# Patient Record
Sex: Male | Born: 1960 | Race: White | Hispanic: No | Marital: Married | Smoking: Current every day smoker
Health system: Southern US, Community
[De-identification: ages and names within clinical notes are randomized; demographics above are authoritative.]

## PROBLEM LIST (undated history)

## (undated) DIAGNOSIS — Z8711 Personal history of peptic ulcer disease: Secondary | ICD-10-CM

## (undated) DIAGNOSIS — K219 Gastro-esophageal reflux disease without esophagitis: Secondary | ICD-10-CM

## (undated) DIAGNOSIS — G8929 Other chronic pain: Secondary | ICD-10-CM

## (undated) DIAGNOSIS — Z87442 Personal history of urinary calculi: Secondary | ICD-10-CM

## (undated) DIAGNOSIS — Z972 Presence of dental prosthetic device (complete) (partial): Secondary | ICD-10-CM

## (undated) DIAGNOSIS — R0981 Nasal congestion: Secondary | ICD-10-CM

## (undated) DIAGNOSIS — M549 Dorsalgia, unspecified: Secondary | ICD-10-CM

## (undated) DIAGNOSIS — R5383 Other fatigue: Secondary | ICD-10-CM

## (undated) DIAGNOSIS — Z8719 Personal history of other diseases of the digestive system: Secondary | ICD-10-CM

## (undated) DIAGNOSIS — F32A Depression, unspecified: Secondary | ICD-10-CM

## (undated) DIAGNOSIS — E785 Hyperlipidemia, unspecified: Secondary | ICD-10-CM

## (undated) DIAGNOSIS — F419 Anxiety disorder, unspecified: Secondary | ICD-10-CM

## (undated) DIAGNOSIS — G629 Polyneuropathy, unspecified: Secondary | ICD-10-CM

## (undated) DIAGNOSIS — T8484XA Pain due to internal orthopedic prosthetic devices, implants and grafts, initial encounter: Secondary | ICD-10-CM

## (undated) DIAGNOSIS — I10 Essential (primary) hypertension: Secondary | ICD-10-CM

## (undated) DIAGNOSIS — F329 Major depressive disorder, single episode, unspecified: Secondary | ICD-10-CM

## (undated) DIAGNOSIS — Z550 Illiteracy and low-level literacy: Secondary | ICD-10-CM

## (undated) DIAGNOSIS — F1911 Other psychoactive substance abuse, in remission: Secondary | ICD-10-CM

## (undated) DIAGNOSIS — E291 Testicular hypofunction: Secondary | ICD-10-CM

## (undated) HISTORY — DX: Gastro-esophageal reflux disease without esophagitis: K21.9

## (undated) HISTORY — PX: ELBOW SURGERY: SHX618

## (undated) HISTORY — PX: CHOLECYSTECTOMY: SHX55

## (undated) HISTORY — PX: NASAL SEPTOPLASTY W/ TURBINOPLASTY: SHX2070

## (undated) HISTORY — DX: Polyneuropathy, unspecified: G62.9

## (undated) HISTORY — DX: Other fatigue: R53.83

## (undated) HISTORY — PX: ESOPHAGOGASTRODUODENOSCOPY: SHX1529

## (undated) HISTORY — DX: Testicular hypofunction: E29.1

## (undated) HISTORY — DX: Other psychoactive substance abuse, in remission: F19.11

## (undated) HISTORY — DX: Other chronic pain: G89.29

## (undated) HISTORY — PX: TONSILLECTOMY: SUR1361

---

## 2002-10-05 ENCOUNTER — Ambulatory Visit (HOSPITAL_COMMUNITY): Admission: RE | Admit: 2002-10-05 | Discharge: 2002-10-05 | Payer: Self-pay | Admitting: Family Medicine

## 2002-10-05 ENCOUNTER — Encounter: Payer: Self-pay | Admitting: Family Medicine

## 2002-12-01 ENCOUNTER — Emergency Department (HOSPITAL_COMMUNITY): Admission: EM | Admit: 2002-12-01 | Discharge: 2002-12-01 | Payer: Self-pay | Admitting: Emergency Medicine

## 2002-12-27 ENCOUNTER — Encounter (HOSPITAL_COMMUNITY): Admission: RE | Admit: 2002-12-27 | Discharge: 2003-01-26 | Payer: Self-pay | Admitting: Orthopedic Surgery

## 2003-01-27 ENCOUNTER — Encounter (HOSPITAL_COMMUNITY): Admission: RE | Admit: 2003-01-27 | Discharge: 2003-02-26 | Payer: Self-pay | Admitting: Orthopedic Surgery

## 2003-09-14 ENCOUNTER — Encounter (HOSPITAL_COMMUNITY): Admission: RE | Admit: 2003-09-14 | Discharge: 2003-10-14 | Payer: Self-pay | Admitting: Preventative Medicine

## 2003-10-09 ENCOUNTER — Ambulatory Visit (HOSPITAL_COMMUNITY): Admission: RE | Admit: 2003-10-09 | Discharge: 2003-10-09 | Payer: Self-pay | Admitting: Internal Medicine

## 2004-07-24 ENCOUNTER — Ambulatory Visit (HOSPITAL_COMMUNITY): Admission: RE | Admit: 2004-07-24 | Discharge: 2004-07-24 | Payer: Self-pay | Admitting: Family Medicine

## 2004-09-10 ENCOUNTER — Encounter (HOSPITAL_COMMUNITY): Admission: RE | Admit: 2004-09-10 | Discharge: 2004-10-10 | Payer: Self-pay | Admitting: Neurosurgery

## 2004-10-10 ENCOUNTER — Encounter (HOSPITAL_COMMUNITY): Admission: RE | Admit: 2004-10-10 | Discharge: 2004-11-09 | Payer: Self-pay | Admitting: Neurosurgery

## 2005-01-02 ENCOUNTER — Observation Stay (HOSPITAL_COMMUNITY): Admission: EM | Admit: 2005-01-02 | Discharge: 2005-01-02 | Payer: Self-pay | Admitting: Emergency Medicine

## 2005-01-02 HISTORY — PX: CARDIAC CATHETERIZATION: SHX172

## 2005-10-15 ENCOUNTER — Other Ambulatory Visit (HOSPITAL_COMMUNITY): Admission: RE | Admit: 2005-10-15 | Discharge: 2005-11-04 | Payer: Self-pay | Admitting: Psychiatry

## 2005-10-15 ENCOUNTER — Ambulatory Visit: Payer: Self-pay | Admitting: Psychiatry

## 2006-05-11 ENCOUNTER — Ambulatory Visit (HOSPITAL_COMMUNITY): Admission: RE | Admit: 2006-05-11 | Discharge: 2006-05-11 | Payer: Self-pay | Admitting: Family Medicine

## 2007-05-29 ENCOUNTER — Emergency Department (HOSPITAL_COMMUNITY): Admission: EM | Admit: 2007-05-29 | Discharge: 2007-05-29 | Payer: Self-pay | Admitting: Emergency Medicine

## 2010-12-20 NOTE — Cardiovascular Report (Signed)
NAME:  Lucas Ramos, Lucas Ramos NO.:  [id number]   MEDICAL RECORD NO.:  [id number]          PATIENT TYPE:  INP   LOCATION:  1826                         FACILITY:  [facility]   PHYSICIAN:  Nanetta Batty, M.D.   DATE OF BIRTH:  1960-08-25   DATE OF PROCEDURE:  01/02/2005  DATE OF DISCHARGE:                              CARDIAC CATHETERIZATION   HISTORY OF PRESENT ILLNESS:  [name] is a 50 year old male married  white male who works as a Corporate investment banker.  He has positive risk factors  for ischemic heart disease including tobacco abuse, hypertension, and  hyperlipidemia.  He has had chest pain for the last 24 hours.  He is on  heparin and nitroglycerin, with no acute changes on his EKG.  Point of care  markers were negative.  He presents now for diagnostic coronary  arteriography to rule out an ischemic etiology.   PROCEDURE DESCRIPTION:  The patient was brought to the second floor [facility] in a postabsorptive state.  He was  premedicated with p.o. Valium.  His right groin was prepped and draped in  the usual sterile fashion.  Xylocaine 1% was used for local anesthesia.  A 6  French sheath was inserted into the right femoral artery using standard  Seldinger technique.  The 6 French right and left Judkins diagnostic  catheters, followed by a 6 French pigtail catheter, were used for selective  coronary angiography, left ventriculography and distal abdominal  aortography.  Visipaque dye was used for the entirety of the case.  __________ and pullback pressures were recorded.   HEMODYNAMICS:  1.  Aortic systolic pressure 122, diastolic pressure 77.  2.  Left ventricular systolic pressure 122, end diastolic pressure 23.   SELECTIVE CORONARY ANGIOGRAPHY:  1.  Left main normal.  2.  LAD normal.  3.  Left circumflex normal.  4.  Right coronary artery was dominant normal.   LEFT VENTRICULOGRAPHY:  RAO left ventriculogram was performed  using 20 mL of  Visipaque dye at 10 mL per second.  The overall LVEF is estimated at greater  than 60% without focal wall motion abnormalities.   DISTAL ABDOMINAL AORTOGRAPHY:  Distal abdominal aortogram was performed  using 20 mL of Visipaque dye at 20 mL per second.  The renal arteries were  widely patent.  The infrarenal abdominal aorta and iliac bifurcation appear  free of significant atherosclerotic changes.   IMPRESSION:  [name] has normal coronary arteries, normal LV function  and normal abdominal aorta.  I believe his chest pain is noncardiac, most  likely related to reflux and/or musculoskeletal.  The sheath was removed  and the right femoral arterial puncture site was hemostatically sealed with  the Star closure device with excellent result.  The ACT was 170.  Patient  left the lab in stable condition.  He will receive 1 g of Ancef and will be  discharged home later this morning.  He will see me back in followup for 2  weeks to assess his groin.      JB/MEDQ  D:  01/02/2005  T:  01/02/2005  Job:  [id number]   cc:   [facility]   [facility]  [address] [place], [place] [address]   Patient's Chart   2nd Floor [facility]   [facility]  [address]  [place], [place] [address]   Patrica Duel, M.D.  [address]  [place]  [place] [address]  Fax: [phone]

## 2010-12-20 NOTE — H&P (Signed)
NAME:  Lucas Ramos NO.:  [id number]   MEDICAL RECORD NO.:  [id number]          PATIENT TYPE:  INP   LOCATION:  1826                         FACILITY:  [facility]   PHYSICIAN:  Lucas Ramos, M.D.    DATE OF BIRTH:  [birth year]   DATE OF ADMISSION:  01/02/2005  DATE OF DISCHARGE:                                HISTORY & PHYSICAL   HISTORY OF PRESENT ILLNESS:  Lucas Ramos is a 50 year old white male  who is admitted to [facility] for further evaluation of chest pain.   The patient, who has no past history of cardiac disease, experienced the  onset of chest pain mid-morning yesterday while he was at work.  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  The chest pain is described as a sharp and burning  discomfort in the left parasternal area.  It radiates through the back to  the interscapular area.  This discomfort had been more or less continuous  yesterday morning.  There were no exacerbating or ameliorating factors.  It  appears not to be related to position, activity, meals, or respirations.  There has been associated nausea, but no dyspnea or diaphoresis.  Hence, he  presented to the emergency department for further evaluation.  His chest  pain was not relieved by sublingual nitroglycerin or by a GI cocktail.   The patient has a number of risk factors including coronary artery disease  and hypertension, dyslipidemia, smoking, and a family history of early  coronary artery disease (father).  There is no history of diabetes mellitus.   PAST MEDICAL HISTORY:  1.  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  2.  There is also a history of sleep apnea for which he has undergone      surgery.   MEDICATIONS:  Toprol-XL and Diovan.   PREVIOUS OPERATIONS:  Surgery for sleep apnea.   ALLERGIES:  None.   SOCIAL HISTORY:  The patient lives with his  wife.  He is employed as a  Games developer of workers Designer, television/film set.  He does not  drink.  He smokes 1/2 pack of cigarettes per day.   FAMILY HISTORY:  Mother is alive and has no history of cardiac disease.  His  father suffered a myocardial infarction in his 75s, as noted above.   REVIEW OF SYSTEMS:  Review of systems reveals no problems related to his  head, eyes, ears, nose, mouth, throat, lungs, gastrointestinal system,  genitourinary system or extremities.  He has no history of neurologic or  psychiatric disorder.  There is no history of fever, chills, or weight loss.   PHYSICAL EXAMINATION:  VITAL SIGNS:  Blood pressure 106/66.  Pulse 63 and  regular.  Respirations 20.  Temperature 97.1.  GENERAL:  The patient was a middle-aged white man in mild discomfort.  He  was alert, oriented, appropriate, and responsive.  HEENT:  Head, eyes, nose and mouth were normal.  NECK:  The neck was without thyromegaly  or adenopathy.  Carotid pulses were  palpable bilaterally and without bruits.  CARDIAC:  Examination revealed a normal S1 and S2.  There was no S3, S4,  murmur, rub, or click.  Cardiac rhythm was regular.  Palpation of the left  mid-parasternal area reproduced the patient's chest pain.  LUNGS:  The lungs were clear.  ABDOMEN:  The abdomen was soft and nontender.  There was no mass,  hepatosplenomegaly, bruit, distention, rebound, guarding, or rigidity.  Bowel sounds were normal.  EXTREMITIES:  The extremities were without edema, deviation, or deformity.  Radial and dorsalis pedal pulses were palpable bilaterally.  NEUROLOGIC:  Brief screening neurologic survey was unremarkable.   LABORATORY AND ACCESSORY CLINICAL DATA:  The electrocardiogram was normal.   The chest radiograph looked normal.   The initial set of cardiac markers revealed a myoglobin of 41.1, CK-MB less  than 1.0 and troponin less than 0.05.  The second set revealed a myoglobin  of 36.8, CK-MB  less than 1.0, and troponin less than 0.05.  Potassium is 3.1  with a BUN of 17 and creatinine 0.6.   The chest radiograph, according to the radiologist, demonstrated no evidence  of active disease.   IMPRESSION:  1.  Chest pain.  Rule out coronary artery disease.  Rule out aortic      dissection.  Rule out gastrointestinal and musculoskeletal etiologies.      The chest pain is sharp and burning in quality and located in the left      parasternal region.  It radiates through to his back to the      interscapular area.  2.  Hypertension.  3.  Dyslipidemia.  4.  Cervical spine degenerative disease.  5.  Sleep apnea.   PLAN:  1.  Telemetry.  2.  Serial cardiac enzymes.  3.  Aspirin.  4.  Intravenous nitroglycerin.  5.  Intravenous heparin.  6.  Chest CT scan.  7.  Analgesia.  8.  Fasting lipid profile.  9.  Discontinue smoking.  10. Further measures per Dr. Severiano Ramos.       MSC/MEDQ  D:  01/02/2005  T:  01/02/2005  Job:  [id number]

## 2011-03-25 MED ORDER — SODIUM CHLORIDE 0.45 % IV SOLN
Freq: Once | INTRAVENOUS | Status: AC
  Administered 2011-03-26: 1000 mL via INTRAVENOUS

## 2011-03-26 ENCOUNTER — Ambulatory Visit (HOSPITAL_COMMUNITY)
Admission: RE | Admit: 2011-03-26 | Discharge: 2011-03-26 | Disposition: A | Discharge status: Home / Self Care | Payer: 59 | Source: Ambulatory Visit | Attending: Internal Medicine | Admitting: Internal Medicine

## 2011-03-26 ENCOUNTER — Other Ambulatory Visit (INDEPENDENT_AMBULATORY_CARE_PROVIDER_SITE_OTHER): Payer: Self-pay | Admitting: Internal Medicine

## 2011-03-26 ENCOUNTER — Encounter (INDEPENDENT_AMBULATORY_CARE_PROVIDER_SITE_OTHER): Payer: Self-pay | Admitting: Internal Medicine

## 2011-03-26 ENCOUNTER — Encounter (HOSPITAL_COMMUNITY)
Admission: RE | Disposition: A | Discharge status: Home / Self Care | Payer: Self-pay | Source: Ambulatory Visit | Attending: Internal Medicine

## 2011-03-26 ENCOUNTER — Encounter (HOSPITAL_COMMUNITY): Payer: Self-pay | Admitting: *Deleted

## 2011-03-26 DIAGNOSIS — D126 Benign neoplasm of colon, unspecified: Secondary | ICD-10-CM | POA: Insufficient documentation

## 2011-03-26 DIAGNOSIS — D128 Benign neoplasm of rectum: Secondary | ICD-10-CM | POA: Insufficient documentation

## 2011-03-26 DIAGNOSIS — Z79899 Other long term (current) drug therapy: Secondary | ICD-10-CM | POA: Insufficient documentation

## 2011-03-26 DIAGNOSIS — E785 Hyperlipidemia, unspecified: Secondary | ICD-10-CM | POA: Insufficient documentation

## 2011-03-26 DIAGNOSIS — Z1211 Encounter for screening for malignant neoplasm of colon: Secondary | ICD-10-CM | POA: Insufficient documentation

## 2011-03-26 DIAGNOSIS — D129 Benign neoplasm of anus and anal canal: Secondary | ICD-10-CM | POA: Insufficient documentation

## 2011-03-26 DIAGNOSIS — I10 Essential (primary) hypertension: Secondary | ICD-10-CM | POA: Insufficient documentation

## 2011-03-26 HISTORY — DX: Depression, unspecified: F32.A

## 2011-03-26 HISTORY — DX: Major depressive disorder, single episode, unspecified: F32.9

## 2011-03-26 HISTORY — PX: COLONOSCOPY: SHX5424

## 2011-03-26 HISTORY — DX: Essential (primary) hypertension: I10

## 2011-03-26 HISTORY — DX: Dorsalgia, unspecified: M54.9

## 2011-03-26 SURGERY — COLONOSCOPY
Anesthesia: Moderate Sedation

## 2011-03-26 MED ORDER — MEPERIDINE HCL 50 MG/ML IJ SOLN
INTRAMUSCULAR | Status: DC | PRN
  Administered 2011-03-26 (×2): 25 mg via INTRAVENOUS

## 2011-03-26 MED ORDER — SODIUM CHLORIDE 0.9 % IJ SOLN
INTRAMUSCULAR | Status: DC | PRN
  Administered 2011-03-26: 5 mL

## 2011-03-26 MED ORDER — MEPERIDINE HCL 50 MG/ML IJ SOLN
INTRAMUSCULAR | Status: AC
  Filled 2011-03-26: qty 1

## 2011-03-26 MED ORDER — MIDAZOLAM HCL 5 MG/5ML IJ SOLN
INTRAMUSCULAR | Status: AC
  Filled 2011-03-26: qty 10

## 2011-03-26 MED ORDER — STERILE WATER FOR IRRIGATION IR SOLN
Status: DC | PRN
  Administered 2011-03-26: 11:00:00

## 2011-03-26 MED ORDER — MIDAZOLAM HCL 5 MG/5ML IJ SOLN
INTRAMUSCULAR | Status: DC | PRN
  Administered 2011-03-26: 2 mg via INTRAVENOUS
  Administered 2011-03-26 (×2): 3 mg via INTRAVENOUS
  Administered 2011-03-26: 2 mg via INTRAVENOUS

## 2011-03-26 NOTE — H&P (Signed)
Lucas Ramos is an 50 y.o. male.   Chief Complaint: Iron deficiency anemia; patient here for EGD and examination of ileal pouch and distal small bowel. HPI: Patient is 50 year old male with complicated GI history is also reviewed my detailed note from 02/18/2011 was developed iron deficiency anemia. He has chronic GERD symptoms well controlled with medication he denies dysphagia nausea or vomiting. He has hematochezia and he experienced when he eats spicy food  which maybe once a week. He also has intermittent LLQ and hypogastric pain. He does not take any OTC NSAIDs. Since I last saw him in the office he fell and broke his left clavicle. Since his office visit he was seen by Dr. Reather Littler and was begun on medication for his osteoporosis he developed abdominal pain and nausea and stopped. He has contacted his office for an alternative medication. Past Medical History  Diagnosis Date  . Hypertension   . Hyperlipemia   . Depression   . Back pain     Past Surgical History  Procedure Date  . Cholecystectomy   . Tonsillectomy   . Nasal septoplasty w/ turbinoplasty   . Esophagogastroduodenoscopy     Family History  Problem Relation Age of Onset  . Pneumonia Mother   . Cancer Father   . Coronary artery disease Father   . Hypertension Father   . Coronary artery disease Sister    Social History:  reports that he has been smoking Cigarettes.  He has a 7.5 pack-year smoking history. He does not have any smokeless tobacco history on file. He reports that he does not drink alcohol or use illicit drugs.  Allergies: No Known Allergies  Medications Prior to Admission  Medication Dose Route Frequency Provider Last Rate Last Dose  . 0.45 % sodium chloride infusion   Intravenous Once Malissa Hippo, MD 20 mL/hr at 03/26/11 1013 1,000 mL at 03/26/11 1013  . meperidine (DEMEROL) 50 MG/ML injection           . midazolam (VERSED) 5 MG/5ML injection           . DISCONTD: meperidine (DEMEROL)  injection    PRN Malissa Hippo, MD   25 mg at 03/26/11 1053  . DISCONTD: midazolam (VERSED) 5 MG/5ML injection    PRN Malissa Hippo, MD   2 mg at 03/26/11 1052  . DISCONTD: simethicone susp in sterile water 1000 mL irrigation    PRN Malissa Hippo, MD      . DISCONTD: sodium chloride 0.9 % injection    PRN Malissa Hippo, MD   5 mL at 03/26/11 1117   Medications Prior to Admission  Medication Sig Dispense Refill  . alprazolam (XANAX) 2 MG tablet Take 2 mg by mouth 4 (four) times daily.        Marland Kitchen buPROPion (WELLBUTRIN XL) 300 MG 24 hr tablet Take 300 mg by mouth every morning.        . hydrocodone-acetaminophen (LORCET-HD) 5-500 MG per capsule Take 1 capsule by mouth every 6 (six) hours as needed.        [name] Oil 300 MG CAPS Take 300 mg by mouth 1 day or 1 dose.        . metoprolol (LOPRESSOR) 50 MG tablet Take 50 mg by mouth 1 day or 1 dose.        . olmesartan-hydrochlorothiazide (BENICAR HCT) 40-12.5 MG per tablet Take 1 tablet by mouth daily.        . simvastatin (  ZOCOR) 40 MG tablet Take 40 mg by mouth 1 day or 1 dose.          No results found for this or any previous visit (from the past 48 hour(s)). No results found.  Review of Systems  Constitutional: Negative for weight loss.  Gastrointestinal: Positive for diarrhea (7 to 8 stools per day; most of the stools are soft) and blood in stool (dark blood with boewl movement when he eats spicy food.). Negative for constipation and melena. Heartburn: heartburn well controlled with Aciphex. Abdominal pain: intermittent hypogastric and LLQ pain.  Genitourinary: Negative for dysuria and hematuria.  Neurological: Positive for weakness.    Blood pressure 141/86, pulse 54, temperature 98.2 F (36.8 C), temperature source Oral, resp. rate 18, height 5\' 7"  (1.702 m), weight 180 lb (81.647 kg), SpO2 99.00%. Physical Exam  Constitutional: He appears well-developed.       thin  HENT:  Mouth/Throat: Oropharynx is clear and moist.  Eyes:  Conjunctivae are normal. No scleral icterus.  Neck: Neck supple. No thyromegaly present.  Cardiovascular: Normal rate, regular rhythm and normal heart sounds.   No murmur heard. Respiratory: Breath sounds normal.  GI: Soft. He exhibits no distension and no mass. There is no tenderness. There is no guarding.  Musculoskeletal: He exhibits no edema.  Lymphadenopathy:    He has no cervical adenopathy.  Neurological: He is alert.  Skin: Skin is warm.     Assessment/Plan Iron deficiency anemia. Chronic GERD. Symptoms well controlled with therapy. History of refractory of  ulcerative colitis. Status post ileoanal procedure well 6 years ago. He has ileal pouch. Esophagogastroduodenoscopy followed by examination pouch and distal small bowel.  REHMAN,NAJEEB U 03/26/2011, 11:58 AM

## 2011-03-26 NOTE — Op Note (Signed)
COLONOSCOPY PROCEDURE REPORT  PATIENT:  Lucas Ramos  MR#:  [id number] Birthdate:  02/19/1961, 50 y.o., male Endoscopist:  Dr. Malissa Hippo, MD Referred By:  Dr. Kirk Ruths, MD Procedure Date: 03/26/2011  Procedure:   Colonoscopy  Indications:  Average risk screening colonoscopy.  Informed Consent: Procedure and risks were reviewed with the patient. Questions were answered and informed consent was obtained. Medications:  Demerol 50 mg IV Versed 10 mg IV  Description of procedure:  After a digital rectal exam was performed, that colonoscope was advanced from the anus through the rectum and colon to the area of the cecum, ileocecal valve and appendiceal orifice. The cecum was deeply intubated. These structures were well-seen and photographed for the record. From the level of the cecum and ileocecal valve, the scope was slowly and cautiously withdrawn. The mucosal surfaces were carefully surveyed utilizing scope tip to flexion to facilitate fold flattening as needed. The scope was pulled down into the rectum where a thorough exam including retroflexion was performed.  Findings:   Prep excellent. 8 mm sessile polyp snared and hepatic flexure following submucosal saline injection. 7 mm sessile polyp at sigmoid colon. Saline-assisted polypectomy performed most of the polyp was coagulated small remnant obtained histology. 8 mm  polyp snared from distal sigmoid colon. 4 small polyps were coagulated using snare tip. 2 were at  distal sigmoid colon and 2 at the rectum.  Therapeutic/Diagnostic Maneuvers Performed:  See above  Complications:  None  Cecal Withdrawal Time:  29 minutes  Impression:  3 polyps were snared ranging in size from 7-8 mm(one from hepatic flexure and two from sigmoid colon). Or small polyps were coagulated as above  Recommendations:  No aspirin for one week. Physician will contact you with biopsy results and further recommendations.  [name] U   03/26/2011 11:25 AM  CC: Dr. Kirk Ruths, MD

## 2011-03-26 NOTE — H&P (Signed)
Lucas Ramos is an 50 y.o. male.   Chief Complaint: Here for colonoscopy HPI: Patient is 50 year old Caucasian male who is here for average risk screening colonoscopy. This is patient's first exam. He denies abdominal pain change in bowel habits rectal bleeding anorexia or weight loss. Family history is negative for colorectal carcinoma.  Past Medical History  Diagnosis Date  . Hypertension   . Hyperlipemia   . Depression   . Back pain     Past Surgical History  Procedure Date  . Cholecystectomy   . Tonsillectomy   . Nasal septoplasty w/ turbinoplasty   . Esophagogastroduodenoscopy     Family History  Problem Relation Age of Onset  . Pneumonia Mother   . Cancer Father   . Coronary artery disease Father   . Hypertension Father   . Coronary artery disease Sister    Social History:  reports that he has been smoking Cigarettes.  He has a 7.5 pack-year smoking history. He does not have any smokeless tobacco history on file. He reports that he does not drink alcohol or use illicit drugs.  Allergies: No Known Allergies  Medications Prior to Admission  Medication Dose Route Frequency Provider Last Rate Last Dose  . 0.45 % sodium chloride infusion   Intravenous Once Lucas Hippo, MD 20 mL/hr at 03/26/11 1013 1,000 mL at 03/26/11 1013  . meperidine (DEMEROL) 50 MG/ML injection           . midazolam (VERSED) 5 MG/5ML injection            Medications Prior to Admission  Medication Sig Dispense Refill  . alprazolam (XANAX) 2 MG tablet Take 2 mg by mouth 4 (four) times daily.        Marland Kitchen buPROPion (WELLBUTRIN XL) 300 MG 24 hr tablet Take 300 mg by mouth every morning.        . hydrocodone-acetaminophen (LORCET-HD) 5-500 MG per capsule Take 1 capsule by mouth every 6 (six) hours as needed.        Lucas Ramos Oil 300 MG CAPS Take 300 mg by mouth 1 day or 1 dose.        . metoprolol (LOPRESSOR) 50 MG tablet Take 50 mg by mouth 1 day or 1 dose.        . olmesartan-hydrochlorothiazide  (BENICAR HCT) 40-12.5 MG per tablet Take 1 tablet by mouth daily.        . simvastatin (ZOCOR) 40 MG tablet Take 40 mg by mouth 1 day or 1 dose.          No results found for this or any previous visit (from the past 48 hour(s)). No results found.  Review of Systems  Constitutional: Negative for weight loss.  Gastrointestinal: Negative for abdominal pain, diarrhea, constipation, blood in stool and melena.    Blood pressure 119/76, pulse 63, temperature 98.2 F (36.8 C), temperature source Oral, resp. rate 18, height 5\' 7"  (1.702 m), weight 180 lb (81.647 kg), SpO2 98.00%. Physical Exam  Constitutional: He appears well-developed and well-nourished.  HENT:  Mouth/Throat: Oropharynx is clear and moist.  Eyes: Conjunctivae are normal. No scleral icterus.  Neck: No thyromegaly present.  Cardiovascular: Normal rate, regular rhythm and normal heart sounds.   No murmur heard. Respiratory: Breath sounds normal.  GI: Soft. He exhibits no distension and no mass. There is no tenderness.  Musculoskeletal: He exhibits no edema.  Lymphadenopathy:    He has no cervical adenopathy.  Neurological: He is alert.  Skin: Skin  is warm and dry.     Assessment/Plan Average risk screening colonoscopy.  Lucas Ramos U 03/26/2011, 10:39 AM

## 2011-03-31 ENCOUNTER — Encounter (INDEPENDENT_AMBULATORY_CARE_PROVIDER_SITE_OTHER): Payer: Self-pay | Admitting: *Deleted

## 2011-04-01 ENCOUNTER — Encounter (HOSPITAL_COMMUNITY): Payer: Self-pay | Admitting: Internal Medicine

## 2011-09-03 ENCOUNTER — Other Ambulatory Visit (HOSPITAL_COMMUNITY): Payer: Self-pay | Admitting: Family Medicine

## 2011-09-03 DIAGNOSIS — R1030 Lower abdominal pain, unspecified: Secondary | ICD-10-CM

## 2011-09-04 ENCOUNTER — Ambulatory Visit (HOSPITAL_COMMUNITY)
Admission: RE | Admit: 2011-09-04 | Discharge: 2011-09-04 | Disposition: A | Discharge status: Home / Self Care | Payer: 59 | Source: Ambulatory Visit | Attending: Family Medicine | Admitting: Family Medicine

## 2011-09-04 ENCOUNTER — Other Ambulatory Visit (HOSPITAL_COMMUNITY): Payer: Self-pay | Admitting: Family Medicine

## 2011-09-04 DIAGNOSIS — R1030 Lower abdominal pain, unspecified: Secondary | ICD-10-CM

## 2011-09-04 DIAGNOSIS — R109 Unspecified abdominal pain: Secondary | ICD-10-CM | POA: Insufficient documentation

## 2011-10-20 ENCOUNTER — Other Ambulatory Visit (HOSPITAL_COMMUNITY): Payer: Self-pay | Admitting: Family Medicine

## 2011-10-20 DIAGNOSIS — M549 Dorsalgia, unspecified: Secondary | ICD-10-CM

## 2011-10-20 DIAGNOSIS — G589 Mononeuropathy, unspecified: Secondary | ICD-10-CM

## 2011-10-22 ENCOUNTER — Other Ambulatory Visit (HOSPITAL_COMMUNITY): Payer: 59

## 2011-10-24 ENCOUNTER — Ambulatory Visit (HOSPITAL_COMMUNITY)
Admission: RE | Admit: 2011-10-24 | Discharge: 2011-10-24 | Disposition: A | Discharge status: Home / Self Care | Payer: 59 | Source: Ambulatory Visit | Attending: Family Medicine | Admitting: Family Medicine

## 2011-10-24 DIAGNOSIS — R209 Unspecified disturbances of skin sensation: Secondary | ICD-10-CM | POA: Insufficient documentation

## 2011-10-24 DIAGNOSIS — M545 Low back pain, unspecified: Secondary | ICD-10-CM | POA: Insufficient documentation

## 2011-10-24 DIAGNOSIS — M5126 Other intervertebral disc displacement, lumbar region: Secondary | ICD-10-CM | POA: Insufficient documentation

## 2011-10-24 DIAGNOSIS — M549 Dorsalgia, unspecified: Secondary | ICD-10-CM

## 2011-10-24 DIAGNOSIS — M47817 Spondylosis without myelopathy or radiculopathy, lumbosacral region: Secondary | ICD-10-CM | POA: Insufficient documentation

## 2011-10-24 DIAGNOSIS — G589 Mononeuropathy, unspecified: Secondary | ICD-10-CM

## 2012-03-04 ENCOUNTER — Encounter (HOSPITAL_COMMUNITY): Payer: Self-pay

## 2012-03-04 ENCOUNTER — Encounter (HOSPITAL_COMMUNITY)
Admission: RE | Admit: 2012-03-04 | Discharge: 2012-03-04 | Disposition: A | Discharge status: Home / Self Care | Payer: 59 | Source: Ambulatory Visit | Attending: Orthopedic Surgery | Admitting: Orthopedic Surgery

## 2012-03-04 ENCOUNTER — Ambulatory Visit (HOSPITAL_COMMUNITY)
Admission: RE | Admit: 2012-03-04 | Discharge: 2012-03-04 | Disposition: A | Discharge status: Home / Self Care | Payer: 59 | Source: Ambulatory Visit | Attending: Surgery | Admitting: Surgery

## 2012-03-04 DIAGNOSIS — Z01818 Encounter for other preprocedural examination: Secondary | ICD-10-CM | POA: Insufficient documentation

## 2012-03-04 DIAGNOSIS — Z01812 Encounter for preprocedural laboratory examination: Secondary | ICD-10-CM | POA: Insufficient documentation

## 2012-03-04 HISTORY — DX: Anxiety disorder, unspecified: F41.9

## 2012-03-04 LAB — CBC
Hemoglobin: 14.9 g/dL (ref 13.0–17.0)
MCV: 93.9 fL (ref 78.0–100.0)
Platelets: 387 10*3/uL (ref 150–400)
RBC: 4.58 MIL/uL (ref 4.22–5.81)
WBC: 15.4 10*3/uL — ABNORMAL HIGH (ref 4.0–10.5)

## 2012-03-04 LAB — URINALYSIS, ROUTINE W REFLEX MICROSCOPIC
Bilirubin Urine: NEGATIVE
Glucose, UA: NEGATIVE mg/dL
Ketones, ur: NEGATIVE mg/dL
pH: 7 (ref 5.0–8.0)

## 2012-03-04 LAB — COMPREHENSIVE METABOLIC PANEL
ALT: 17 U/L (ref 0–53)
AST: 23 U/L (ref 0–37)
CO2: 32 mEq/L (ref 19–32)
Chloride: 99 mEq/L (ref 96–112)
Creatinine, Ser: 0.82 mg/dL (ref 0.50–1.35)
GFR calc non Af Amer: >90 mL/min (ref >90)
Total Bilirubin: 0.3 mg/dL (ref 0.3–1.2)

## 2012-03-04 LAB — PROTIME-INR: INR: 0.94 (ref 0.00–1.49)

## 2012-03-04 NOTE — Pre-Procedure Instructions (Signed)
20 Lucas Ramos  03/04/2012   Your procedure is scheduled on: Monday, August, 8, 2013  Report to [facility] at 05:30 AM.  Call this number if you have problems the morning of surgery: [phone]   Remember:   Do not eat food or drink :After Midnight.    Take these medicines the morning of surgery with A SIP OF WATER: Alprazolam (Xanax), Bupropion (Wellbutrin), Metoprolol (Lopressor), Hydrocodone (Lorcet) if needed for pain.    Do not wear jewelry.  Do not wear lotions, powders, or perfumes.   Men may shave face and neck.  Do not bring valuables to the hospital.  Contacts, dentures or bridgework may not be worn into surgery.  Leave suitcase in the car. After surgery it may be brought to your room.  For patients admitted to the hospital, checkout time is 11:00 AM the day of discharge.   Patients discharged the day of surgery will not be allowed to drive home.  Name and phone number of your driver: Family/ Friend  Special Instructions: CHG Shower Use Special Wash: 1/2 bottle night before surgery and 1/2 bottle morning of surgery.   Please read over the following fact sheets that you were given: Pain Booklet, Coughing and Deep Breathing and Surgical Site Infection Prevention, Incentive Spirometry

## 2012-03-04 NOTE — Consult Note (Signed)
Anesthesia Chart Review:  Patient is a 51 year old male scheduled for left hip arthroscopy with debridement on 03/08/12 by Dr. Eulah Pont for labral tear.  History includes smoking, HTN, depression, anxiety, HLD, back pain, kidney stones, short term memory loss, bleeding ulcer '90's.  PCP is listed as Dr. Regino Schultze.  Labs noted. WBC 15.4.  Glucose 106.  UA is negative.  I faxed WBC results to Dr. Greig Right office.  Afebrile at PAT.  CXR showed no acute cardiopulmonary disease.    EKG shows SB.  He had a cardiac cath on 01/02/05 by Dr. Allyson Sabal [facility]) that showed normal coronaries, normal LV function, and normal abdominal aorta.  If no significant change in his status then anticipate he can proceed from an Anesthesia standpoint.  Shonna Chock, PA-C

## 2012-03-04 NOTE — Progress Notes (Signed)
Patient had a stress test several years ago at [facility] and had a Cath in 2006. Results from Cath in EPIC. Patient also had a sleep study several years ago at [facility]. Denied wearing a BiPAP or CPAP at bedtime.

## 2012-03-05 MED ORDER — LIDOCAINE HCL (PF) 1 % IJ SOLN
INTRAMUSCULAR | Status: AC
  Filled 2012-03-05: qty 30

## 2012-03-07 MED ORDER — CEFAZOLIN SODIUM-DEXTROSE 2-3 GM-% IV SOLR
2.0000 g | INTRAVENOUS | Status: AC
  Administered 2012-03-08: 2 g via INTRAVENOUS
  Filled 2012-03-07: qty 50

## 2012-03-08 ENCOUNTER — Ambulatory Visit (HOSPITAL_COMMUNITY): Payer: 59 | Admitting: Vascular Surgery

## 2012-03-08 ENCOUNTER — Encounter (HOSPITAL_COMMUNITY)
Admission: RE | Disposition: A | Discharge status: Home / Self Care | Payer: Self-pay | Source: Ambulatory Visit | Attending: Orthopedic Surgery

## 2012-03-08 ENCOUNTER — Encounter (HOSPITAL_COMMUNITY): Payer: Self-pay | Admitting: Vascular Surgery

## 2012-03-08 ENCOUNTER — Ambulatory Visit (HOSPITAL_COMMUNITY)
Admission: RE | Admit: 2012-03-08 | Discharge: 2012-03-08 | Disposition: A | Discharge status: Home / Self Care | Payer: 59 | Source: Ambulatory Visit | Attending: Orthopedic Surgery | Admitting: Orthopedic Surgery

## 2012-03-08 ENCOUNTER — Encounter (HOSPITAL_COMMUNITY): Payer: Self-pay | Admitting: *Deleted

## 2012-03-08 ENCOUNTER — Ambulatory Visit (HOSPITAL_COMMUNITY): Payer: 59

## 2012-03-08 DIAGNOSIS — M9420 Chondromalacia, unspecified site: Secondary | ICD-10-CM | POA: Insufficient documentation

## 2012-03-08 DIAGNOSIS — M24159 Other articular cartilage disorders, unspecified hip: Secondary | ICD-10-CM | POA: Insufficient documentation

## 2012-03-08 DIAGNOSIS — F17200 Nicotine dependence, unspecified, uncomplicated: Secondary | ICD-10-CM | POA: Insufficient documentation

## 2012-03-08 DIAGNOSIS — I10 Essential (primary) hypertension: Secondary | ICD-10-CM | POA: Insufficient documentation

## 2012-03-08 DIAGNOSIS — Z4789 Encounter for other orthopedic aftercare: Secondary | ICD-10-CM

## 2012-03-08 DIAGNOSIS — F329 Major depressive disorder, single episode, unspecified: Secondary | ICD-10-CM | POA: Insufficient documentation

## 2012-03-08 DIAGNOSIS — F3289 Other specified depressive episodes: Secondary | ICD-10-CM | POA: Insufficient documentation

## 2012-03-08 DIAGNOSIS — F411 Generalized anxiety disorder: Secondary | ICD-10-CM | POA: Insufficient documentation

## 2012-03-08 HISTORY — PX: HIP ARTHROSCOPY: SHX668

## 2012-03-08 SURGERY — ARTHROSCOPY HIP
Anesthesia: General | Site: Hip | Laterality: Left | Wound class: Clean

## 2012-03-08 MED ORDER — ONDANSETRON HCL 4 MG/2ML IJ SOLN
INTRAMUSCULAR | Status: DC | PRN
  Administered 2012-03-08: 4 mg via INTRAVENOUS

## 2012-03-08 MED ORDER — HYDROMORPHONE HCL PF 1 MG/ML IJ SOLN
INTRAMUSCULAR | Status: AC
  Filled 2012-03-08: qty 1

## 2012-03-08 MED ORDER — HYDROCODONE-ACETAMINOPHEN 10-325 MG PO TABS: 1.0000 | ORAL_TABLET | ORAL | Status: AC | PRN

## 2012-03-08 MED ORDER — BUPIVACAINE HCL 0.5 % IJ SOLN
INTRAMUSCULAR | Status: DC | PRN
  Administered 2012-03-08: 35 mL

## 2012-03-08 MED ORDER — MIDAZOLAM HCL 5 MG/5ML IJ SOLN
INTRAMUSCULAR | Status: DC | PRN
  Administered 2012-03-08: 2 mg via INTRAVENOUS

## 2012-03-08 MED ORDER — SODIUM CHLORIDE 0.9 % IR SOLN
Status: DC | PRN
  Administered 2012-03-08: 3000 mL

## 2012-03-08 MED ORDER — IPRATROPIUM BROMIDE HFA 17 MCG/ACT IN AERS
INHALATION_SPRAY | RESPIRATORY_TRACT | Status: DC | PRN
  Administered 2012-03-08 (×2): 4 via RESPIRATORY_TRACT

## 2012-03-08 MED ORDER — HYDROCODONE-ACETAMINOPHEN 5-325 MG PO TABS
ORAL_TABLET | ORAL | Status: AC
  Filled 2012-03-08: qty 2

## 2012-03-08 MED ORDER — NEOSTIGMINE METHYLSULFATE 1 MG/ML IJ SOLN
INTRAMUSCULAR | Status: DC | PRN
  Administered 2012-03-08: 5 mg via INTRAVENOUS

## 2012-03-08 MED ORDER — PROMETHAZINE HCL 25 MG/ML IJ SOLN: 6.2500 mg | INTRAMUSCULAR | Status: DC | PRN

## 2012-03-08 MED ORDER — HYDROCODONE-ACETAMINOPHEN 5-325 MG PO TABS
2.0000 | ORAL_TABLET | Freq: Once | ORAL | Status: AC
  Administered 2012-03-08: 2 via ORAL

## 2012-03-08 MED ORDER — ACETAMINOPHEN 10 MG/ML IV SOLN
INTRAVENOUS | Status: DC | PRN
  Administered 2012-03-08: 1000 mg via INTRAVENOUS

## 2012-03-08 MED ORDER — MIDAZOLAM HCL 2 MG/2ML IJ SOLN: 1.0000 mg | INTRAMUSCULAR | Status: DC | PRN

## 2012-03-08 MED ORDER — PROPOFOL 10 MG/ML IV EMUL
INTRAVENOUS | Status: DC | PRN
  Administered 2012-03-08: 200 mg via INTRAVENOUS

## 2012-03-08 MED ORDER — GLYCOPYRROLATE 0.2 MG/ML IJ SOLN
INTRAMUSCULAR | Status: DC | PRN
  Administered 2012-03-08: .8 mg via INTRAVENOUS

## 2012-03-08 MED ORDER — DEXAMETHASONE SODIUM PHOSPHATE 4 MG/ML IJ SOLN
INTRAMUSCULAR | Status: DC | PRN
  Administered 2012-03-08: 8 mg via INTRAVENOUS

## 2012-03-08 MED ORDER — LIDOCAINE HCL (CARDIAC) 20 MG/ML IV SOLN
INTRAVENOUS | Status: DC | PRN
  Administered 2012-03-08: 80 mg via INTRAVENOUS

## 2012-03-08 MED ORDER — KETOROLAC TROMETHAMINE 30 MG/ML IJ SOLN
INTRAMUSCULAR | Status: DC | PRN
  Administered 2012-03-08: 30 mg via INTRAVENOUS

## 2012-03-08 MED ORDER — ACETAMINOPHEN 10 MG/ML IV SOLN
INTRAVENOUS | Status: AC
  Filled 2012-03-08: qty 100

## 2012-03-08 MED ORDER — FENTANYL CITRATE 0.05 MG/ML IJ SOLN
INTRAMUSCULAR | Status: DC | PRN
  Administered 2012-03-08 (×2): 50 ug via INTRAVENOUS
  Administered 2012-03-08: 100 ug via INTRAVENOUS
  Administered 2012-03-08: 50 ug via INTRAVENOUS

## 2012-03-08 MED ORDER — FENTANYL CITRATE 0.05 MG/ML IJ SOLN: 50.0000 ug | INTRAMUSCULAR | Status: DC | PRN

## 2012-03-08 MED ORDER — VECURONIUM BROMIDE 10 MG IV SOLR
INTRAVENOUS | Status: DC | PRN
  Administered 2012-03-08: 2 mg via INTRAVENOUS

## 2012-03-08 MED ORDER — ROCURONIUM BROMIDE 100 MG/10ML IV SOLN
INTRAVENOUS | Status: DC | PRN
  Administered 2012-03-08: 50 mg via INTRAVENOUS

## 2012-03-08 MED ORDER — HYDROMORPHONE HCL PF 1 MG/ML IJ SOLN
0.2500 mg | INTRAMUSCULAR | Status: DC | PRN
  Administered 2012-03-08 (×4): 0.5 mg via INTRAVENOUS

## 2012-03-08 MED ORDER — LACTATED RINGERS IV SOLN
INTRAVENOUS | Status: DC | PRN
  Administered 2012-03-08: 07:00:00 via INTRAVENOUS

## 2012-03-08 MED ORDER — ARTIFICIAL TEARS OP OINT
TOPICAL_OINTMENT | OPHTHALMIC | Status: DC | PRN
  Administered 2012-03-08: 1 via OPHTHALMIC

## 2012-03-08 SURGICAL SUPPLY — 37 items
BLADE CUTTER GATOR 3.5 (BLADE) ×1 IMPLANT
BLADE DA 4.2 (BLADE) IMPLANT
BLADE GREAT WHITE 4.2 (BLADE) ×1 IMPLANT
BLADE SURG 11 STRL SS (BLADE) ×2 IMPLANT
BOOTCOVER CLEANROOM LRG (PROTECTIVE WEAR) ×4 IMPLANT
CLOTH BEACON ORANGE TIMEOUT ST (SAFETY) ×2 IMPLANT
DRAPE STERI IOBAN 125X83 (DRAPES) ×2 IMPLANT
DRSG EMULSION OIL 3X3 NADH (GAUZE/BANDAGES/DRESSINGS) ×2 IMPLANT
DRSG MEPILEX BORDER 4X4 (GAUZE/BANDAGES/DRESSINGS) ×1 IMPLANT
DRSG PAD ABDOMINAL 8X10 ST (GAUZE/BANDAGES/DRESSINGS) ×3 IMPLANT
DURAPREP 26ML APPLICATOR (WOUND CARE) ×2 IMPLANT
ELECT MENISCUS 165MM 90D (ELECTRODE) ×1 IMPLANT
GLOVE BIOGEL PI IND STRL 8 (GLOVE) ×1 IMPLANT
GLOVE BIOGEL PI INDICATOR 8 (GLOVE) ×1
GLOVE ORTHO TXT STRL SZ7.5 (GLOVE) ×4 IMPLANT
GOWN STRL NON-REIN LRG LVL3 (GOWN DISPOSABLE) ×6 IMPLANT
KIT HIP ARTHROSCOPY (SET/KITS/TRAYS/PACK) ×2 IMPLANT
KIT ROOM TURNOVER OR (KITS) ×2 IMPLANT
MANIFOLD NEPTUNE II (INSTRUMENTS) ×2 IMPLANT
MARKER SKIN DUAL TIP RULER LAB (MISCELLANEOUS) ×2 IMPLANT
NDL HYPO 18GX1.5 BLUNT FILL (NEEDLE) ×1 IMPLANT
NEEDLE HYPO 18GX1.5 BLUNT FILL (NEEDLE) ×2 IMPLANT
PACK SURGICAL SETUP 50X90 (CUSTOM PROCEDURE TRAY) ×2 IMPLANT
PAD ARMBOARD 7.5X6 YLW CONV (MISCELLANEOUS) ×4 IMPLANT
SET ARTHROSCOPY TUBING (MISCELLANEOUS) ×2
SET ARTHROSCOPY TUBING LN (MISCELLANEOUS) ×1 IMPLANT
SHAVER EXTENDED LENGTH 4.2 (BLADE) ×1 IMPLANT
SPONGE GAUZE 4X4 12PLY (GAUZE/BANDAGES/DRESSINGS) ×2 IMPLANT
SPONGE LAP 18X18 X RAY DECT (DISPOSABLE) ×2 IMPLANT
SUT ETHILON 4 0 PS 2 18 (SUTURE) ×2 IMPLANT
TAPE CLOTH 4X10 WHT NS (GAUZE/BANDAGES/DRESSINGS) ×2 IMPLANT
TOWEL OR 17X24 6PK STRL BLUE (TOWEL DISPOSABLE) ×2 IMPLANT
TOWEL OR 17X26 10 PK STRL BLUE (TOWEL DISPOSABLE) ×2 IMPLANT
TUBE CONNECTING 12X1/4 (SUCTIONS) ×2 IMPLANT
WAND 50 DEG COVAC W/CORD (SURGICAL WAND) IMPLANT
WAND 90 DEG TURBOVAC W/CORD (SURGICAL WAND) ×1 IMPLANT
WATER STERILE IRR 1000ML POUR (IV SOLUTION) ×2 IMPLANT

## 2012-03-08 NOTE — Transfer of Care (Signed)
Immediate Anesthesia Transfer of Care Note  Patient: Lucas Ramos  Procedure(s) Performed: Procedure(s) (LRB): ARTHROSCOPY HIP (Left)  Patient Location: PACU  Anesthesia Type: General  Level of Consciousness: awake, alert , oriented and patient cooperative  Airway & Oxygen Therapy: Patient Spontanous Breathing and Patient connected to face mask oxygen  Post-op Assessment: Report given to PACU RN, Post -op Vital signs reviewed and stable and Patient moving all extremities X 4  Post vital signs: Reviewed and stable  Complications: No apparent anesthesia complications

## 2012-03-08 NOTE — H&P (Signed)
                   51 yo male with documented labral tear left hip by MRI. Full H&P scanned into EPIC. Failed conservative treatment. Brought to OR today for debriding arthroscopy left hip. Procedure, risks, benefits and complications outlined to patient. All questions answered. Patient seen and examined today.

## 2012-03-08 NOTE — Anesthesia Preprocedure Evaluation (Addendum)
Anesthesia Evaluation  Patient identified by MRN, date of birth, ID band Patient awake    Reviewed: Allergy & Precautions, H&P , NPO status , Patient's Chart, lab work & pertinent test results, reviewed documented beta blocker date and time   Airway Mallampati: II TM Distance: >3 FB Neck ROM: Full    Dental  (+) Partial Lower and Dental Advisory Given   Pulmonary Current Smoker,  + rhonchi         Cardiovascular hypertension, Pt. on home beta blockers     Neuro/Psych Anxiety Depression    GI/Hepatic   Endo/Other    Renal/GU      Musculoskeletal   Abdominal   Peds  Hematology   Anesthesia Other Findings   Reproductive/Obstetrics                         Anesthesia Physical Anesthesia Plan  ASA: II  Anesthesia Plan: General   Post-op Pain Management:    Induction: Intravenous  Airway Management Planned: Oral ETT  Additional Equipment:   Intra-op Plan:   Post-operative Plan: Extubation in OR  Informed Consent: I have reviewed the patients History and Physical, chart, labs and discussed the procedure including the risks, benefits and alternatives for the proposed anesthesia with the patient or authorized representative who has indicated his/her understanding and acceptance.     Plan Discussed with: CRNA and Surgeon  Anesthesia Plan Comments:         Anesthesia Quick Evaluation

## 2012-03-08 NOTE — Brief Op Note (Signed)
03/08/2012  9:14 AM  PATIENT:  Lucas Ramos  51 y.o. male  PRE-OPERATIVE DIAGNOSIS:  labral tear left hip   POST-OPERATIVE DIAGNOSIS:  labral tear left hip   PROCEDURE:  Procedure(s) (LRB): ARTHROSCOPY HIP (Left) with debridement  SURGEON:  Surgeon(s) and Role:    * Loreta Ave, MD - Primary  PHYSICIAN ASSISTANT: Zonia Kief M    ANESTHESIA:   general  EBL:  Total I/O In: 700 [I.V.:700] Out: -   BLOOD ADMINISTERED:none  SPECIMEN:  No Specimen  DISPOSITION OF SPECIMEN:  N/A  COUNTS:  YES   PATIENT DISPOSITION:  PACU - hemodynamically stable.

## 2012-03-08 NOTE — Anesthesia Postprocedure Evaluation (Signed)
  Anesthesia Post-op Note  Patient: Lucas Ramos  Procedure(s) Performed: Procedure(s) (LRB): ARTHROSCOPY HIP (Left)  Patient Location: PACU  Anesthesia Type: General  Level of Consciousness: awake  Airway and Oxygen Therapy: Patient Spontanous Breathing  Post-op Pain: mild  Post-op Assessment: Post-op Vital signs reviewed, Patient's Cardiovascular Status Stable, Respiratory Function Stable, Patent Airway, No signs of Nausea or vomiting and Pain level controlled  Post-op Vital Signs: stable  Complications: No apparent anesthesia complications

## 2012-03-08 NOTE — Anesthesia Postprocedure Evaluation (Signed)
  Anesthesia Post-op Note  Patient: Lucas Ramos  Procedure(s) Performed: Procedure(s) (LRB): ARTHROSCOPY HIP (Left)  Patient Location: PACU  Anesthesia Type: General  Level of Consciousness: awake and alert   Airway and Oxygen Therapy: Patient Spontanous Breathing  Post-op Pain: mild  Post-op Assessment: Post-op Vital signs reviewed, Patient's Cardiovascular Status Stable, Respiratory Function Stable, Patent Airway, No signs of Nausea or vomiting and Pain level controlled  Post-op Vital Signs: stable  Complications: No apparent anesthesia complications

## 2012-03-08 NOTE — Interval H&P Note (Signed)
History and Physical Interval Note:  03/08/2012 7:20 AM  Lucas Ramos  has presented today for surgery, with the diagnosis of labral tear left hip   The various methods of treatment have been discussed with the patient and family. After consideration of risks, benefits and other options for treatment, the patient has consented to  Procedure(s) (LRB): ARTHROSCOPY HIP (Left) as a surgical intervention .  The patient's history has been reviewed, patient examined, no change in status, stable for surgery.  I have reviewed the patient's chart and labs.  Questions were answered to the patient's satisfaction.     Charell Faulk F

## 2012-03-08 NOTE — Progress Notes (Signed)
Report given to jamie rn as caregiver 

## 2012-03-09 ENCOUNTER — Encounter (HOSPITAL_COMMUNITY): Payer: Self-pay | Admitting: Orthopedic Surgery

## 2012-03-09 NOTE — Op Note (Signed)
NAME:  Lucas Ramos, Lucas Ramos NO.:  [id number]  MEDICAL RECORD NO.:  [id number]  LOCATION:  [facility]                         FACILITY:  [facility]  PHYSICIAN:  Loreta Ave, M.D. DATE OF BIRTH:  06-28-1961  DATE OF PROCEDURE:  03/08/2012 DATE OF DISCHARGE:  03/08/2012                              OPERATIVE REPORT   PREOPERATIVE DIAGNOSIS:  Left hip labral tear.  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  PROCEDURE:  Left hip exam under anesthesia.  Arthroscopy and debridement of the labrum as well as chondroplasty.  SURGEON-Marcelo Ickes:  Loreta Ave, MD  ASSISTANT:  Genene Churn. Barry Dienes, PA was present throughout the entire case and necessary for timely completion of procedure.  ANESTHESIA:  General.  BLOOD LOSS:  Minimal.  SPECIMENS:  None.  CULTURES:  None.  COMPLICATION:  None.  DRESSINGS:  Soft compressive.  PROCEDURE:  The patient was brought to the operating room, placed on the operating table in supine position.  After adequate anesthesia has been obtained, turned to a lateral position with the fracture table and traction applied.  The large fluoroscopy unit used to guide this.  Able to distract the hip about a centimeter to allow access.  Traction has been removed.  Prepped and draped in usual sterile fashion.  Traction reapplied.  Two portals, posterolateral and anterolateral.  Established this first with spinal needle, then guidewire, then expanded to allow entrance of the arthroscope and the shaver.  The arthroscope introduced, hip distended and inspected.  Marked complex tearing superior labrum extending all the way down the front aspect.  With a shaver this was debrided back to a nice stable surface removing all displaced fragments. A small area of grade 3 chondromalacia debrided.  The entire hip was then examined utilizing the scope from front and back.  No  significant findings.  Really, a paucity of degenerative changes.  After confirming adequate excision, it was irrigated through the scope system.  The hip was injected with Marcaine.  Portals injected with Marcaine.  Portals closed with nylon. All traction removed.  Fluoroscopy used to confirmed nice reduction after traction removed.  Anesthesia reversed.  Brought to recovery room. Tolerated surgery well.  No complications.     Loreta Ave, M.D.     DFM/MEDQ  D:  03/08/2012  T:  03/09/2012  Job:  [id number]

## 2013-07-01 ENCOUNTER — Ambulatory Visit (HOSPITAL_COMMUNITY)
Admission: RE | Admit: 2013-07-01 | Discharge: 2013-07-01 | Disposition: A | Discharge status: Home / Self Care | Payer: 59 | Source: Ambulatory Visit | Attending: Family Medicine | Admitting: Family Medicine

## 2013-07-01 ENCOUNTER — Other Ambulatory Visit (HOSPITAL_COMMUNITY): Payer: Self-pay | Admitting: Family Medicine

## 2013-07-01 DIAGNOSIS — G8929 Other chronic pain: Secondary | ICD-10-CM

## 2013-07-01 DIAGNOSIS — R5381 Other malaise: Secondary | ICD-10-CM

## 2013-07-01 DIAGNOSIS — R079 Chest pain, unspecified: Secondary | ICD-10-CM | POA: Insufficient documentation

## 2013-11-17 ENCOUNTER — Emergency Department (HOSPITAL_COMMUNITY): Payer: 59

## 2013-11-17 ENCOUNTER — Emergency Department (HOSPITAL_COMMUNITY)
Admission: EM | Admit: 2013-11-17 | Discharge: 2013-11-17 | Disposition: A | Discharge status: Home / Self Care | Payer: 59 | Attending: Emergency Medicine | Admitting: Emergency Medicine

## 2013-11-17 ENCOUNTER — Encounter (HOSPITAL_COMMUNITY): Payer: Self-pay | Admitting: Emergency Medicine

## 2013-11-17 ENCOUNTER — Other Ambulatory Visit: Payer: Self-pay

## 2013-11-17 DIAGNOSIS — Z87442 Personal history of urinary calculi: Secondary | ICD-10-CM | POA: Insufficient documentation

## 2013-11-17 DIAGNOSIS — F3289 Other specified depressive episodes: Secondary | ICD-10-CM | POA: Insufficient documentation

## 2013-11-17 DIAGNOSIS — Z79899 Other long term (current) drug therapy: Secondary | ICD-10-CM | POA: Insufficient documentation

## 2013-11-17 DIAGNOSIS — I10 Essential (primary) hypertension: Secondary | ICD-10-CM | POA: Insufficient documentation

## 2013-11-17 DIAGNOSIS — E785 Hyperlipidemia, unspecified: Secondary | ICD-10-CM | POA: Insufficient documentation

## 2013-11-17 DIAGNOSIS — F411 Generalized anxiety disorder: Secondary | ICD-10-CM | POA: Insufficient documentation

## 2013-11-17 DIAGNOSIS — G4700 Insomnia, unspecified: Secondary | ICD-10-CM | POA: Insufficient documentation

## 2013-11-17 DIAGNOSIS — F17200 Nicotine dependence, unspecified, uncomplicated: Secondary | ICD-10-CM | POA: Insufficient documentation

## 2013-11-17 DIAGNOSIS — R000 Tachycardia, unspecified: Secondary | ICD-10-CM | POA: Insufficient documentation

## 2013-11-17 DIAGNOSIS — F1910 Other psychoactive substance abuse, uncomplicated: Secondary | ICD-10-CM | POA: Insufficient documentation

## 2013-11-17 DIAGNOSIS — Z872 Personal history of diseases of the skin and subcutaneous tissue: Secondary | ICD-10-CM | POA: Insufficient documentation

## 2013-11-17 DIAGNOSIS — F329 Major depressive disorder, single episode, unspecified: Secondary | ICD-10-CM | POA: Insufficient documentation

## 2013-11-17 LAB — CBC WITH DIFFERENTIAL/PLATELET
Basophils Absolute: 0.1 10*3/uL (ref 0.0–0.1)
Basophils Relative: 1 % (ref 0–1)
EOS ABS: 0.5 10*3/uL (ref 0.0–0.7)
Eosinophils Relative: 3 % (ref 0–5)
HCT: 40.9 % (ref 39.0–52.0)
HEMOGLOBIN: 14.2 g/dL (ref 13.0–17.0)
LYMPHS ABS: 2.9 10*3/uL (ref 0.7–4.0)
Lymphocytes Relative: 21 % (ref 12–46)
MCH: 31.6 pg (ref 26.0–34.0)
MCHC: 34.7 g/dL (ref 30.0–36.0)
MCV: 90.9 fL (ref 78.0–100.0)
MONOS PCT: 11 % (ref 3–12)
Monocytes Absolute: 1.5 10*3/uL — ABNORMAL HIGH (ref 0.1–1.0)
NEUTROS ABS: 8.6 10*3/uL — AB (ref 1.7–7.7)
NEUTROS PCT: 64 % (ref 43–77)
Platelets: 533 10*3/uL — ABNORMAL HIGH (ref 150–400)
RBC: 4.5 MIL/uL (ref 4.22–5.81)
RDW: 12.5 % (ref 11.5–15.5)
WBC: 13.5 10*3/uL — ABNORMAL HIGH (ref 4.0–10.5)

## 2013-11-17 LAB — URINALYSIS, ROUTINE W REFLEX MICROSCOPIC
Bilirubin Urine: NEGATIVE
GLUCOSE, UA: NEGATIVE mg/dL
Hgb urine dipstick: NEGATIVE
LEUKOCYTES UA: NEGATIVE
Nitrite: NEGATIVE
PROTEIN: NEGATIVE mg/dL
Specific Gravity, Urine: 1.02 (ref 1.005–1.030)
UROBILINOGEN UA: 0.2 mg/dL (ref 0.0–1.0)
pH: 5.5 (ref 5.0–8.0)

## 2013-11-17 LAB — COMPREHENSIVE METABOLIC PANEL
ALK PHOS: 99 U/L (ref 39–117)
ALT: 17 U/L (ref 0–53)
AST: 25 U/L (ref 0–37)
Albumin: 3.7 g/dL (ref 3.5–5.2)
BILIRUBIN TOTAL: 0.5 mg/dL (ref 0.3–1.2)
BUN: 12 mg/dL (ref 6–23)
CHLORIDE: 99 meq/L (ref 96–112)
CO2: 26 meq/L (ref 19–32)
Calcium: 9.4 mg/dL (ref 8.4–10.5)
Creatinine, Ser: 0.8 mg/dL (ref 0.50–1.35)
GLUCOSE: 88 mg/dL (ref 70–99)
POTASSIUM: 3.5 meq/L — AB (ref 3.7–5.3)
Sodium: 138 mEq/L (ref 137–147)
Total Protein: 7.1 g/dL (ref 6.0–8.3)

## 2013-11-17 LAB — RAPID URINE DRUG SCREEN, HOSP PERFORMED
AMPHETAMINES: POSITIVE — AB
Barbiturates: NOT DETECTED
Benzodiazepines: POSITIVE — AB
Cocaine: NOT DETECTED
Opiates: POSITIVE — AB
Tetrahydrocannabinol: NOT DETECTED

## 2013-11-17 LAB — TROPONIN I

## 2013-11-17 MED ORDER — SODIUM CHLORIDE 0.9 % IV SOLN
1000.0000 mL | Freq: Once | INTRAVENOUS | Status: AC
  Administered 2013-11-17: 1000 mL via INTRAVENOUS

## 2013-11-17 MED ORDER — NALOXONE HCL 1 MG/ML IJ SOLN
INTRAMUSCULAR | Status: AC
  Filled 2013-11-17: qty 2

## 2013-11-17 MED ORDER — NALOXONE HCL 1 MG/ML IJ SOLN
2.0000 mg | Freq: Once | INTRAMUSCULAR | Status: AC
  Administered 2013-11-17: 2 mg via INTRAVENOUS

## 2013-11-17 NOTE — ED Notes (Signed)
Pt w/ snoring respiration, pt had to be sternal rubbed in order to wake. Pt attempted to give a urine sample but was unable at this time.

## 2013-11-17 NOTE — Discharge Instructions (Signed)
Please see the list of contacts below - call today to get help with your substance abuse.   Emergency Department Resource Guide 1) Find a Doctor and Pay Out of Pocket Although you won't have to find out who is covered by your insurance plan, it is a good idea to ask around and get recommendations. You will then need to call the office and see if the doctor you have chosen will accept you as a new patient and what types of options they offer for patients who are self-pay. Some doctors offer discounts or will set up payment plans for their patients who do not have insurance, but you will need to ask so you aren't surprised when you get to your appointment.  2) Contact Your Local Health Department Not all health departments have doctors that can see patients for sick visits, but many do, so it is worth a call to see if yours does. If you don't know where your local health department is, you can check in your phone book. The CDC also has a tool to help you locate your state's health department, and many state websites also have listings of all of their local health departments.  3) Find a [facility] If your illness is not likely to be very severe or complicated, you may want to try a walk in clinic. These are popping up all over the country in pharmacies, drugstores, and shopping centers. They're usually staffed by nurse practitioners or physician assistants that have been trained to treat common illnesses and complaints. They're usually fairly quick and inexpensive. However, if you have serious medical issues or chronic medical problems, these are probably not your best option.  No Primary Care Doctor: - Call Health Connect at  [phone] - they can help you locate a primary care doctor that  accepts your insurance, provides certain services, etc. - Physician Referral Service- [phone]  Chronic Pain Problems: Organization         Address  Phone   Notes  [facility]  [phone] Patients need to be referred by their primary care doctor.   Medication Assistance: Organization         Address  Phone   Notes  [facility] Medication [facility] [address] [place], [place] [address] [phone] --Must be a resident of [facility] -- Must have NO insurance coverage whatsoever (no Medicaid/ Medicare, etc.) -- The pt. MUST have a primary care doctor that directs their care regularly and follows them in the community   MedAssist  [phone]   [employer]  [phone]    Agencies that provide inexpensive medical care: Organization         Address  Phone   Notes  [place]  [phone]   [facility]    [phone]   [facility] [place], [place] [address] [phone]   [place] [address] [place] [phone]   Planned Parenthood    [phone]   [facility]    [phone]   [place] and [place] [address], [place] Phone:  [phone], Fax:  [phone] Hours of Operation:  9 am - 6 pm, M-F.  Also accepts Medicaid/Medicare and self-pay.  [facility] for [place], [address], [place] Phone: [phone], Fax: [phone]. Hours of  Operation:  8:30 am - 5:30 pm, M-F.  Also accepts Medicaid and self-pay.  [facility] [place] [address], [place] Phone: [phone]   [place], [place], [place] [phone], Ext. [phone] Mondays & Thursdays: 7-9 AM.  First 15 patients are seen on a first come, first serve basis.    [place] Providers:  Organization         Address  Phone   Notes  [facility] [address], [place] [phone] Also accepts self-pay patients.  [facility] [address], [place]   [phone]   [place], [address], [place] [phone]   [facility] [address], [place] [phone]   Lucianne Lei [address], [place]   [phone] Only accepts [place] Access [place] patients after they have their name applied to their card.   Self-Pay (no insurance) in [facility]:  Organization         Address  Phone   Notes  Sickle Cell Patients, [facility] Internal Medicine Start [phone]   [facility]gent Care [place] [phone]   [facility] [place]  [place], [place], [place] [phone]   [facility]/Dr. Osei-Bonsu  [address] [place] or [address], [place] [phone] Phone number for both [place] and [place] locations is the same.  Urgent Medical and [facility] [address] [place] [phone]   [facility] [address] [place] or [address] [phone]   [facility] [address], [place] [phone], phone; [phone], fax Sees patients 1st and 3rd Saturday of every month.  Must not qualify for public or private insurance (i.e. Medicaid, Medicare, [place] Health Choice, Veterans' Benefits)  Household income should be no more than 200% of the poverty level The clinic cannot treat you if you are pregnant or think you are pregnant  Sexually transmitted diseases are not treated at the clinic.    Dental Care: Organization         Address  Phone  Notes  [facility] Department of [facility] [place] [phone] Accepts children up to age 67 who are enrolled in [place] or [place]; pregnant women with a Medicaid card; and children who have applied for Medicaid or [place] Health Choice, but  were declined, whose parents can pay a reduced fee at time of service.  [facility] Department of [facility]  [address], [place] [phone] Accepts children up to age 45 who are enrolled in [place] or [place]; pregnant women with a Medicaid card; and children who have applied for Medicaid or [place] Health Choice, but were declined, whose parents can pay a reduced fee at time of service.  [place] Adult Dental Access PROGRAM  [place] [phone] Patients are seen by appointment only. Walk-ins are not accepted. [place] will see patients 9 years of age and older. Monday - Tuesday (8am-5pm) Most Wednesdays (8:30-5pm) $30 per visit, cash only  [facility] Adult Dental Access PROGRAM  [address], [facility] [phone] Patients are seen  by appointment only. Walk-ins are not accepted. [place] will see patients 2 years of age and older. One Wednesday Evening (Monthly: Volunteer Based).  $30 per visit, cash only  [place]  [phone] for adults; Children under age 54, call Graduate Pediatric Dentistry at [phone]. Children aged 40-14, please call [phone] to request a pediatric application.  Dental services are provided in all areas of dental care including fillings, crowns and bridges, complete and partial dentures, implants, gum treatment, root canals, and extractions. Preventive care is also provided. Treatment is provided to both adults and children. Patients are selected via a lottery and there is often a waiting list.   [facility] [address], [place]  [phone] [email or web address]   [facility] [address] [place], [place] [phone], Ext. [phone] Second and Fourth Thursday of each month, opens at 6:30 AM; Clinic ends at 9 AM.  Patients are seen on a first-come first-served basis, and a limited number are seen during each clinic.    [facility]  [address] [place], [place] [phone]   Eligibility Requirements You must have lived in [place], [place], or [place] counties for at least the last three months.   You cannot be eligible for state or federal sponsored [employer], including [employer], [place], or [employer].   You generally cannot be eligible for healthcare insurance through your employer.    How to apply: Eligibility screenings are held every Tuesday and Wednesday afternoon from 1:00 pm until 4:00 pm. You do not need an appointment for the interview!  [facility] [address] [place], [place]   [place]  [place] Department  [place]  [phone]    Behavioral Health Resources in the Community: Intensive Outpatient Programs Organization         Address  Phone  Notes  [place]. [address] [place], [place] [phone]   [facility] Outpatient [address], [place], [place]   ADS: Alcohol & Drug Svcs [address], [place], [place]   [place] [address]  [place], [place] or [phone]   Substance Abuse Resources Organization         Address  Phone  Notes  Alcohol and Drug Services  [phone]   [place]  [phone]   [facility] [place]   Chinita Pester  [phone]   Residential & Outpatient Substance Abuse Program  [phone]   Psychological Services Organization         Address  Phone  Notes  [facility] [place]  [place]  [phone]   [place] [address], [place] or [phone]    Mobile Crisis Teams Organization         Address  Phone  Notes  [facility], Mobile Crisis Care  Unit  [phone]   [facility]ive Psychotherapeutic Services  [address] [place], [place]   [place] [address] [place] [phone]    Self-Help/Support Groups Organization         Address  Phone             Notes  [place]. of Time - variety of support groups  [place] Call for more  information  Narcotics Anonymous (NA), [facility] [address], [place]  2 meetings at this location   Residential Facilities manager         Address  Phone  Notes  [facility]AP Residential Treatment [place],    Page  [phone]   [facility]  [address], [place] [phone], [place], [place]   [place], [place] [phone] Admissions: 8am-3pm M-F  Incentives Substance [place] [address],    [place], [place] [phone]   [facility] [address] [place], [place], [place]   [facility] [address]  [place], [place]   [facility] - Intensive Outpatient [address], [place], [place]   [facility] ([place].) [place].,  [place], [place] [phone] or [phone]   [facility]idential Treatment Services (RTS) [address], [place], [place] Accepts Medicaid  [facility] [place] [address],  [place] [phone] Substance Abuse/Addiction Treatment   [facility] Organization         Address  Phone  Notes  [facility]  [phone]   Domenic Schwab, PhD [address] Arlis Porta [place], [place]   [phone] or [phone]   [place], [place] [phone]   [facility] [address] [place], [place] [phone] Insurance/Medicaid/sponsorship through [facility] and Families [address]                                     [place], [place] [phone] [place] [address], [place] [phone]    Dr. Adele Schilder  [phone]   [facility] Dept. 1) [address] [place] 2) [place] 3)  [place] [address], [place] [phone]  [phone]   [place] [phone] or [phone] (After Hours)

## 2013-11-17 NOTE — ED Notes (Signed)
EMS states pt heart rate was in the 130 when they arrived. Pt ambulated to truck. They states pt fell asleep several times in the back of the truck

## 2013-11-17 NOTE — ED Notes (Signed)
Prior to d/c, pt was able to stay awake. He drank a cup of water and walked to the restroom, then to the end of the hallway and back to his room. Pts family concerned that pt will not seek help. Spoke with the patient and he agrees that he needs help and will seek help.

## 2013-11-17 NOTE — ED Notes (Signed)
Pt arrived from home by EMS. Pt states just generally not feeling well. Pt wife states he has not slept in the past 4 days & has taking some medications per wife in unknown amounts. Pt denies wanting to do any harm. Pt very sleepy but arousalable

## 2013-11-17 NOTE — ED Notes (Signed)
Pt O2 sats dropping when pt falls to sleep.

## 2013-11-17 NOTE — ED Provider Notes (Signed)
CSN: [id number]     Arrival date & time 11/17/13  0108 History   First MD Initiated Contact with Patient 11/17/13 0231     Chief Complaint  Patient presents with  . Insomnia  . Altered Mental Status     (Consider location/radiation/quality/duration/timing/severity/associated sxs/prior Treatment) HPI Comments: 53 year old male with a history of polysubstance abuse presents to the hospital with a complaint of altered mental status. He arrives from home by paramedic transport, his wife states that he has not been sleeping very well, has been taking his medications at home including excessive amounts of melatonin and drugs of abuse which she suspects he has been buying at the corner store. The patient is somnolent but arousable, he denies overdosing, denies suicide, and denies using drugs of abuse but does endorse the fact that he has a pill bottle that contains Xanax, Adderall and hydrocodone, none of which are prescribed to him. He denies physical complaints. The spouse denies any suicidal actions or complaints lately, no history of depression, he does have a history of cocaine use, heavy alcohol use and has spent time in rehabilitation  Patient is a 54 y.o. male presenting with altered mental status. The history is provided by the patient and the spouse.  Altered Mental Status   Past Medical History  Diagnosis Date  . Hypertension   . Hyperlipemia   . Depression   . Back pain   . Anxiety   . Memory loss     short term  . Ulcer     Bleeding ulcer in 1990  . Kidney stone 1998   Past Surgical History  Procedure Laterality Date  . Cholecystectomy    . Tonsillectomy    . Nasal septoplasty w/ turbinoplasty    . Esophagogastroduodenoscopy    . Colonoscopy  03/26/2011    Procedure: COLONOSCOPY;  Surgeon: Rogene Houston, MD;  [facility];  Service: Endoscopy;  Laterality: N/A;  . Elbow surgery      Bilateral  . Hip arthroscopy  03/08/2012    Procedure: ARTHROSCOPY HIP;   Surgeon: Ninetta Lights, MD;  Location: [facility];  Service: Orthopedics;  Laterality: Left;  left hip arthoscopy with debridement    Family History  Problem Relation Age of Onset  . Pneumonia Mother   . Cancer Father   . Coronary artery disease Father   . Hypertension Father   . Coronary artery disease Sister    History  Substance Use Topics  . Smoking status: Current Every Day Smoker -- 0.25 packs/day for 30 years    Types: Cigarettes  . Smokeless tobacco: Not on file  . Alcohol Use: No    Review of Systems  All other systems reviewed and are negative.     Allergies  Review of patient's allergies indicates no known allergies.  Home Medications   Prior to Admission medications   Medication Sig Start Date End Date Taking? Authorizing Provider  acetaminophen (TYLENOL) 325 MG tablet Take 650 mg by mouth every 6 (six) hours as needed.   Yes Historical Provider, MD  buPROPion (WELLBUTRIN XL) 300 MG 24 hr tablet Take 300 mg by mouth every morning.     Yes Historical Provider, MD  HYDROcodone-acetaminophen (NORCO) 10-325 MG per tablet Take 1 tablet by mouth 4 (four) times daily.   Yes Historical Provider, MD  Melatonin 3 MG CAPS Take 3 mg by mouth at bedtime.   Yes Historical Provider, MD  metoprolol succinate (TOPROL-XL) 50 MG 24 hr tablet Take 50  mg by mouth daily. Take with or immediately following a meal.   Yes Historical Provider, MD  olmesartan-hydrochlorothiazide (BENICAR HCT) 40-12.5 MG per tablet Take 1 tablet by mouth daily.     Yes Historical Provider, MD  simvastatin (ZOCOR) 40 MG tablet Take 40 mg by mouth 1 day or 1 dose.     Yes Historical Provider, MD  alprazolam Duanne Moron) 2 MG tablet Take 2 mg by mouth 4 (four) times daily.      Historical Provider, MD   BP 148/98  Pulse 104  Temp(Src) 98.2 F (36.8 C) (Oral)  Resp 20  Ht 5\' 8"  (1.727 m)  Wt 190 lb (86.183 kg)  BMI 28.90 kg/m2  SpO2 98% Physical Exam  Nursing note and vitals reviewed. Constitutional: He  appears well-developed and well-nourished.  HENT:  Head: Normocephalic and atraumatic.  Mouth/Throat: Oropharynx is clear and moist. No oropharyngeal exudate.  Eyes: Conjunctivae and EOM are normal. Pupils are equal, round, and reactive to light. Right eye exhibits no discharge. Left eye exhibits no discharge. No scleral icterus.  Pupils 6 mm and reactive  Neck: Normal range of motion. Neck supple. No JVD present. No thyromegaly present.  Cardiovascular: Regular rhythm, normal heart sounds and intact distal pulses.  Exam reveals no gallop and no friction rub.   No murmur heard. Tachycardic to 110  Pulmonary/Chest: Effort normal and breath sounds normal. No respiratory distress. He has no wheezes. He has no rales.  Abdominal: Soft. Bowel sounds are normal. He exhibits no distension and no mass. There is no tenderness.  Musculoskeletal: Normal range of motion. He exhibits no edema and no tenderness.  Lymphadenopathy:    He has no cervical adenopathy.  Neurological: He is alert. Coordination normal.  Skin: Skin is warm and dry. No rash noted. No erythema.  Psychiatric: He has a normal mood and affect. His behavior is normal.    ED Course  Procedures (including critical care time) Labs Review Labs Reviewed  CBC WITH DIFFERENTIAL - Abnormal; Notable for the following:    WBC 13.5 (*)    Platelets 533 (*)    Neutro Abs 8.6 (*)    Monocytes Absolute 1.5 (*)    All other components within normal limits  COMPREHENSIVE METABOLIC PANEL - Abnormal; Notable for the following:    Potassium 3.5 (*)    All other components within normal limits  URINALYSIS, ROUTINE W REFLEX MICROSCOPIC - Abnormal; Notable for the following:    Ketones, ur TRACE (*)    All other components within normal limits  URINE RAPID DRUG SCREEN (HOSP PERFORMED) - Abnormal; Notable for the following:    Opiates POSITIVE (*)    Benzodiazepines POSITIVE (*)    Amphetamines POSITIVE (*)    All other components within normal  limits  TROPONIN I    Imaging Review Dg Chest Portable 1 View  11/17/2013   CLINICAL DATA:  Altered mental status.  Tachycardia.  EXAM: PORTABLE CHEST - 1 VIEW  COMPARISON:  07/01/2013  FINDINGS: Heart size is normal. Mediastinal shadows are normal. There is mild chronic pulmonary scarring. No evidence of active infiltrate, mass, effusion or collapse. No bony abnormality.  IMPRESSION: Mild chronic pulmonary scarring.  No focal or active process.   Electronically Signed   By: Nelson Chimes M.D.   On: 11/17/2013 01:41    ED ECG REPORT  I personally interpreted this EKG   Date: 11/17/2013   Rate: 107  Rhythm: sinus tachycardia  QRS Axis: normal  Intervals: normal  ST/T Wave abnormalities: normal  Conduction Disutrbances:none  Narrative Interpretation:   Old EKG Reviewed: none available   MDM   Final diagnoses:  Substance abuse    The patient has ongoing somnolence, likely related to drug overdose, he refuses to admit to what he took however given the diverse number and types of medications that he has on his person which are not prescribed to him I suspect this is polysubstance, amphetamine, benzos, opiates.  We will give Narcan, hold off on benzo reversal secondary to possible seizures due to chronic abuse, vital signs reflect a mild tachycardia likely related to medications as well.  No response to narcan.  0650:  Pt reexamined - no acute findings on labs other than what we expected based on meds he arrived with - now awake, alert, and back to baseline.  He states that he does not want to have inpatient treatment for his substance abuse but agrees to do outpt - agreeable to resource list - family is supportive, pt stable for d/c.  Heart rate now 95.  Filed Vitals:   11/17/13 0400 11/17/13 0500 11/17/13 0600 11/17/13 0642  BP: 117/71 127/83 140/75 148/98  Pulse: 73  80 104  Temp:      TempSrc:      Resp:    20  Height:      Weight:      SpO2: 100%  97% 98%     Johnna Acosta, MD 11/17/13 [phone]

## 2013-11-17 NOTE — ED Notes (Signed)
Pt w/ snoring respirations. NAD noted.

## 2013-11-17 NOTE — ED Notes (Signed)
Family member is concerned about pt getting medications from the street. Asking about a drug screen. Pt has appointment w/ PCP next week. Family member advised to inform EDP of her concerns.

## 2016-02-28 ENCOUNTER — Encounter (INDEPENDENT_AMBULATORY_CARE_PROVIDER_SITE_OTHER): Payer: Self-pay | Admitting: *Deleted

## 2016-04-23 ENCOUNTER — Ambulatory Visit (HOSPITAL_COMMUNITY)
Admission: RE | Admit: 2016-04-23 | Discharge: 2016-04-23 | Disposition: A | Discharge status: Home / Self Care | Payer: Self-pay | Source: Ambulatory Visit | Attending: Pulmonary Disease | Admitting: Pulmonary Disease

## 2016-04-23 ENCOUNTER — Other Ambulatory Visit (HOSPITAL_COMMUNITY): Payer: Self-pay | Admitting: Pulmonary Disease

## 2016-04-23 DIAGNOSIS — R7611 Nonspecific reaction to tuberculin skin test without active tuberculosis: Secondary | ICD-10-CM

## 2016-08-04 HISTORY — PX: FINGER SURGERY: SHX640

## 2016-09-04 HISTORY — PX: ORIF WRIST FRACTURE: SHX2133

## 2016-09-12 ENCOUNTER — Encounter (HOSPITAL_COMMUNITY): Payer: Self-pay | Admitting: Emergency Medicine

## 2016-09-12 ENCOUNTER — Emergency Department (HOSPITAL_COMMUNITY): Payer: Worker's Compensation

## 2016-09-12 ENCOUNTER — Emergency Department (HOSPITAL_COMMUNITY)
Admission: EM | Admit: 2016-09-12 | Discharge: 2016-09-12 | Disposition: A | Discharge status: Home / Self Care | Payer: Worker's Compensation | Attending: Emergency Medicine | Admitting: Emergency Medicine

## 2016-09-12 DIAGNOSIS — Y990 Civilian activity done for income or pay: Secondary | ICD-10-CM | POA: Insufficient documentation

## 2016-09-12 DIAGNOSIS — W19XXXA Unspecified fall, initial encounter: Secondary | ICD-10-CM

## 2016-09-12 DIAGNOSIS — F17210 Nicotine dependence, cigarettes, uncomplicated: Secondary | ICD-10-CM | POA: Insufficient documentation

## 2016-09-12 DIAGNOSIS — Y939 Activity, unspecified: Secondary | ICD-10-CM | POA: Insufficient documentation

## 2016-09-12 DIAGNOSIS — M25532 Pain in left wrist: Secondary | ICD-10-CM | POA: Insufficient documentation

## 2016-09-12 DIAGNOSIS — W010XXA Fall on same level from slipping, tripping and stumbling without subsequent striking against object, initial encounter: Secondary | ICD-10-CM | POA: Insufficient documentation

## 2016-09-12 DIAGNOSIS — S6992XA Unspecified injury of left wrist, hand and finger(s), initial encounter: Secondary | ICD-10-CM | POA: Diagnosis present

## 2016-09-12 DIAGNOSIS — Y929 Unspecified place or not applicable: Secondary | ICD-10-CM | POA: Insufficient documentation

## 2016-09-12 DIAGNOSIS — I10 Essential (primary) hypertension: Secondary | ICD-10-CM | POA: Diagnosis not present

## 2016-09-12 DIAGNOSIS — Z79899 Other long term (current) drug therapy: Secondary | ICD-10-CM | POA: Diagnosis not present

## 2016-09-12 MED ORDER — HYDROMORPHONE HCL 1 MG/ML IJ SOLN
1.0000 mg | Freq: Once | INTRAMUSCULAR | Status: AC
  Administered 2016-09-12: 1 mg via INTRAVENOUS
  Filled 2016-09-12: qty 1

## 2016-09-12 MED ORDER — ONDANSETRON HCL 4 MG/2ML IJ SOLN
4.0000 mg | Freq: Once | INTRAMUSCULAR | Status: AC
  Administered 2016-09-12: 4 mg via INTRAVENOUS
  Filled 2016-09-12: qty 2

## 2016-09-12 MED ORDER — HYDROMORPHONE HCL 4 MG PO TABS: 4.0000 mg | ORAL_TABLET | Freq: Four times a day (QID) | ORAL | 0 refills | Status: DC | PRN

## 2016-09-12 NOTE — ED Triage Notes (Signed)
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

## 2016-09-12 NOTE — Discharge Instructions (Signed)
Follow-up with Dr.   Aline Brochure on Monday

## 2016-09-12 NOTE — ED Notes (Signed)
MD at bedside checking pt splint.

## 2016-09-12 NOTE — ED Provider Notes (Signed)
[facility] DEPT Provider Note   CSN: [id number] Arrival date & time: 09/12/16  1058     History   Chief Complaint Chief Complaint  Patient presents with  . Fall  . Wrist Pain    HPI Lucas Ramos is a 56 y.o. male.  Patient states that he fell onto his left wrist today.    Fall  This is a new problem. The problem occurs rarely. The problem has not changed since onset.Pertinent negatives include no chest pain, no abdominal pain and no headaches. Nothing aggravates the symptoms. Nothing relieves the symptoms.    Past Medical History:  Diagnosis Date  . Anxiety   . Back pain   . Depression   . Hyperlipemia   . Hypertension   . Kidney stone 1998  . Memory loss    short term  . Ulcer ([place])    Bleeding ulcer in 1990    There are no active problems to display for this patient.   Past Surgical History:  Procedure Laterality Date  . CHOLECYSTECTOMY    . COLONOSCOPY  03/26/2011   Procedure: COLONOSCOPY;  Surgeon: Rogene Houston, MD;  [facility];  Service: Endoscopy;  Laterality: N/A;  . ELBOW SURGERY     Bilateral  . ESOPHAGOGASTRODUODENOSCOPY    . HIP ARTHROSCOPY  03/08/2012   Procedure: ARTHROSCOPY HIP;  Surgeon: Ninetta Lights, MD;  Location: [facility];  Service: Orthopedics;  Laterality: Left;  left hip arthoscopy with debridement   . NASAL SEPTOPLASTY W/ TURBINOPLASTY    . TONSILLECTOMY         Home Medications    Prior to Admission medications   Medication Sig Start Date End Date Taking? Authorizing Provider  acetaminophen (TYLENOL) 325 MG tablet Take 650 mg by mouth every 6 (six) hours as needed.   Yes Historical Provider, MD  HYDROcodone-acetaminophen (NORCO) 10-325 MG per tablet Take 1 tablet by mouth 4 (four) times daily.   Yes Historical Provider, MD  losartan (COZAAR) 100 MG tablet Take 100 mg by mouth daily.   Yes Historical Provider, MD  Melatonin 3 MG CAPS Take 3 mg by mouth at bedtime.   Yes Historical Provider, MD  metoprolol  (LOPRESSOR) 50 MG tablet Take 50 mg by mouth daily.   Yes Historical Provider, MD  simvastatin (ZOCOR) 40 MG tablet Take 40 mg by mouth 1 day or 1 dose.     Yes Historical Provider, MD  HYDROmorphone (DILAUDID) 4 MG tablet Take 1 tablet (4 mg total) by mouth every 6 (six) hours as needed for severe pain. 09/12/16   Milton Gailey, MD    Family History Family History  Problem Relation Age of Onset  . Cancer Father   . Coronary artery disease Father   . Hypertension Father   . Pneumonia Mother   . Coronary artery disease Sister     Social History Social History  Substance Use Topics  . Smoking status: Current Every Day Smoker    Packs/day: 0.25    Years: 30.00    Types: Cigarettes  . Smokeless tobacco: Not on file  . Alcohol use No     Allergies   Patient has no known allergies.   Review of Systems Review of Systems  Constitutional: Negative for appetite change and fatigue.  HENT: Negative for congestion, ear discharge and sinus pressure.   Eyes: Negative for discharge.  Respiratory: Negative for cough.   Cardiovascular: Negative for chest pain.  Gastrointestinal: Negative for abdominal pain and diarrhea.  Genitourinary: Negative for frequency and hematuria.  Musculoskeletal: Negative for back pain.       Wrist pain  Skin: Negative for rash.  Neurological: Negative for seizures and headaches.  Psychiatric/Behavioral: Negative for hallucinations.     Physical Exam Updated Vital Signs BP 179/95   Pulse 63   Temp 98.2 F (36.8 C) (Oral)   Resp 20   Ht 5\' 9"  (1.753 m)   Wt 175 lb (79.4 kg)   SpO2 100%   BMI 25.84 kg/m   Physical Exam  Constitutional: He is oriented to person, place, and time. He appears well-developed.  HENT:  Head: Normocephalic.  Eyes: Conjunctivae are normal.  Neck: No tracheal deviation present.  Cardiovascular:  No murmur heard. Musculoskeletal: Normal range of motion.  Swollen deformed left wrist. Radial pulse 2+.  Neurological: He  is oriented to person, place, and time.  Skin: Skin is warm.  Psychiatric: He has a normal mood and affect.     ED Treatments / Results  Labs (all labs ordered are listed, but only abnormal results are displayed) Labs Reviewed - No data to display  EKG  EKG Interpretation None       Radiology Dg Wrist Complete Left  Result Date: 09/12/2016 CLINICAL DATA:  Fall at work today. Pain and swelling of left wrist. EXAM: LEFT WRIST - COMPLETE 3+ VIEW COMPARISON:  None. FINDINGS: There is a comminuted distal left radial fracture. Mild displacement of posterior fracture fragments and posterior angulation of the distal fragments. Small ulnar styloid fracture. No subluxation or dislocation. IMPRESSION: Comminuted, posteriorly displaced and angulated distal left radial fracture. Ulnar styloid fracture. Electronically Signed   By: Rolm Baptise M.D.   On: 09/12/2016 11:49    Procedures Procedures (including critical care time)  Medications Ordered in ED Medications  HYDROmorphone (DILAUDID) injection 1 mg (1 mg Intravenous Given 09/12/16 1331)  ondansetron (ZOFRAN) injection 4 mg (4 mg Intravenous Given 09/12/16 1331)  HYDROmorphone (DILAUDID) injection 1 mg (1 mg Intravenous Given 09/12/16 1434)     Initial Impression / Assessment and Plan / ED Course  I have reviewed the triage vital signs and the nursing notes.  Pertinent labs & imaging results that were available during my care of the patient were reviewed by me and considered in my medical decision making (see chart for details).     Patient has a comminuted fracture of his distal radius. It is closed. I spoke with orthopedics who will see him in a couple days. He will be given pain medicine and put in a sugar tong splint  Final Clinical Impressions(s) / ED Diagnoses   Final diagnoses:  Fall, initial encounter    New Prescriptions New Prescriptions   HYDROMORPHONE (DILAUDID) 4 MG TABLET    Take 1 tablet (4 mg total) by mouth every  6 (six) hours as needed for severe pain.     Milton Houdeshell, MD 09/12/16 [phone]

## 2016-09-12 NOTE — ED Notes (Signed)
Pt made aware to return if symptoms worsen or if any life threatening symptoms occur.   

## 2016-09-12 NOTE — ED Notes (Signed)
Pt instructed not to drive or work with dilaudid that was prescribed. Pt told to loosen ace wrap, should his fingers go numb or turn blue, and if pain, numbness, and discoloration persists, to come back to ER.

## 2016-09-15 ENCOUNTER — Telehealth: Payer: Self-pay | Admitting: Orthopedic Surgery

## 2016-09-15 NOTE — Telephone Encounter (Signed)
Patient's wife called wanting to setup an ER Followup appointment for her husband for his fractured wrist with Dr. Aline Brochure. I explained to her that Dr. Aline Brochure is out of the office and I offered to get him in to be seen by Dr. Luna Glasgow as early as tomorrow morning, but she said "no thank you" and hung up.

## 2017-04-04 DIAGNOSIS — T8484XA Pain due to internal orthopedic prosthetic devices, implants and grafts, initial encounter: Secondary | ICD-10-CM

## 2017-04-04 HISTORY — DX: Pain due to internal orthopedic prosthetic devices, implants and grafts, initial encounter: T84.84XA

## 2017-04-09 ENCOUNTER — Ambulatory Visit (HOSPITAL_COMMUNITY): Payer: Self-pay

## 2017-04-23 ENCOUNTER — Encounter (HOSPITAL_BASED_OUTPATIENT_CLINIC_OR_DEPARTMENT_OTHER): Payer: Self-pay | Admitting: *Deleted

## 2017-04-23 ENCOUNTER — Other Ambulatory Visit: Payer: Self-pay | Admitting: Orthopedic Surgery

## 2017-04-23 DIAGNOSIS — R0981 Nasal congestion: Secondary | ICD-10-CM

## 2017-04-23 HISTORY — DX: Nasal congestion: R09.81

## 2017-04-23 NOTE — Pre-Procedure Instructions (Signed)
To have EKG done at [facility].

## 2017-04-23 NOTE — H&P (Signed)
Lucas Ramos is an 56 y.o. male.   CC / Reason for Visit: MRI follow-up left wrist HPI: This patient returns reevaluation, indicating that the sensation of soft tissue crepitus or catching continues dorsally.  His pain is really in that region, particularly with wrist flexion.  It seems to be dorsal and radial.  He does not have any pain over the dorsal central and dorsal ulnar aspects of the wrist.  The MRI scan reveals some dorsally prominent spike of bone where a portion of dorsal cortex is rotated such that it projects dorsally.  In addition there is a concavity in the distal radius under it, consistent with partial union of the radius.  Lastly 1 of the distal pegs appears to be projecting beyond the osseous confines of the distal radius ulnarly, unclear whether it is outside the confines of the cartilage itself there appears to be no erosions of the adjacent lunate.  Past Medical History:  Diagnosis Date  . Anxiety   . Back pain   . Depression   . History of gastric ulcer   . History of kidney stones   . Hyperlipidemia   . Hypertension    when asked if BP is under control, pt. states "so-so"; has been on med. x 15 yr.  . Illiterate    states unable to read and write  . Nasal congestion 04/23/2017  . Painful orthopaedic hardware ([place]) 04/2017   left wrist  . Wears dentures    full    Past Surgical History:  Procedure Laterality Date  . CARDIAC CATHETERIZATION  01/02/2005   normal coronaries  . CHOLECYSTECTOMY    . COLONOSCOPY  03/26/2011   Procedure: COLONOSCOPY;  Surgeon: Rogene Houston, MD;  [facility];  Service: Endoscopy;  Laterality: N/A;  . ELBOW SURGERY Bilateral   . ESOPHAGOGASTRODUODENOSCOPY    . FINGER SURGERY Left 2018   thumb  . HIP ARTHROSCOPY  03/08/2012   Procedure: ARTHROSCOPY HIP;  Surgeon: Ninetta Lights, MD;  Location: [facility];  Service: Orthopedics;  Laterality: Left;  left hip arthoscopy with debridement   . NASAL SEPTOPLASTY W/  TURBINOPLASTY    . ORIF WRIST FRACTURE Left 09/2016  . TONSILLECTOMY  1990s    Family History  Problem Relation Age of Onset  . Cancer Father   . Coronary artery disease Father   . Hypertension Father   . Pneumonia Mother   . Coronary artery disease Sister    Social History:  reports that he has been smoking Cigarettes.  He has a 13.86 pack-year smoking history. He has never used smokeless tobacco. He reports that he does not drink alcohol or use drugs.  Allergies: No Known Allergies  No prescriptions prior to admission.    No results found for this or any previous visit (from the past 48 hour(s)). No results found.  Review of Systems  All other systems reviewed and are negative.   Height 5' 8"  (1.727 m), weight 77.1 kg (170 lb). Physical Exam  Constitutional:  WD, WN, NAD HEENT:  NCAT, EOMI Neuro/Psych:  Alert & oriented to person, place, and time; appropriate mood & affect Lymphatic: No generalized UE edema or lymphadenopathy Extremities / MSK:  Both UE are normal with respect to appearance, ranges of motion, joint stability, muscle strength/tone, sensation, & perfusion except as otherwise noted:  The incisions on both sides continue to mature.  Thumb IP extension to 0, opposition to the small finger PIP joint.  With active wrist flexion,  it appears that either the transferred tendon or some other piece of soft tissue is perhaps binding or catching on a portion of the underlying distal radius.  There is some tenderness to palpation dorsal radially, but none dorsal centrally over the dorsal distal ulnar portion of the radius.  Labs / Xrays:  No radiographic studies obtained today.  Assessment:  Continue symptomatic soft tissue triggering or catching associated with crepitus following tendon transfer, with possible intra-articular distal hardware  Plan: I discussed these findings with him, as well as a plan for operative intervention.  I do not think that his problem will  resolve without such.  I reviewed with him a plan to proceed with dorsal exploration of the area of catching or triggering, using local anesthetic and MAC, so that he can be compliant during the procedure.  I anticipate possibly finding that this dorsal spike of bone is the culprit, and anticipate removing it with a running Dora, morselizing it, and placing it into the deeper void, using his bone graft.  Nonetheless, with him actively able to comply, we should be able to determine the exact source of the crepitus and ensure that it is eliminated before directing attention volarly.  We will attempt to remove the distal pegs it may be projecting intra-articularly, but may need to convert to general anesthetic at that time.  He understands this plan and wishes to proceed.  The details of the operative procedure were discussed with the patient.  Questions were invited and answered.  In addition to the goal of the procedure, the risks of the procedure to include but not limited to bleeding; infection; damage to the nerves or blood vessels that could result in bleeding, numbness, weakness, chronic pain, and the need for additional procedures; stiffness; the need for revision surgery; and anesthetic risks were reviewed.  No specific outcome was guaranteed or implied.  Informed consent was obtained.  Ianmichael Amescua A., MD 04/23/2017, 8:51 PM

## 2017-04-24 ENCOUNTER — Encounter (HOSPITAL_COMMUNITY)
Admission: RE | Admit: 2017-04-24 | Discharge: 2017-04-24 | Disposition: A | Discharge status: Home / Self Care | Payer: Worker's Compensation | Source: Ambulatory Visit | Attending: Orthopedic Surgery | Admitting: Orthopedic Surgery

## 2017-04-24 DIAGNOSIS — M549 Dorsalgia, unspecified: Secondary | ICD-10-CM | POA: Diagnosis not present

## 2017-04-24 DIAGNOSIS — Z01810 Encounter for preprocedural cardiovascular examination: Secondary | ICD-10-CM | POA: Diagnosis present

## 2017-04-24 DIAGNOSIS — I10 Essential (primary) hypertension: Secondary | ICD-10-CM | POA: Insufficient documentation

## 2017-04-24 DIAGNOSIS — M25532 Pain in left wrist: Secondary | ICD-10-CM | POA: Diagnosis not present

## 2017-04-24 DIAGNOSIS — F419 Anxiety disorder, unspecified: Secondary | ICD-10-CM | POA: Insufficient documentation

## 2017-04-24 DIAGNOSIS — Z01812 Encounter for preprocedural laboratory examination: Secondary | ICD-10-CM | POA: Diagnosis present

## 2017-04-24 DIAGNOSIS — F329 Major depressive disorder, single episode, unspecified: Secondary | ICD-10-CM | POA: Insufficient documentation

## 2017-04-24 DIAGNOSIS — E785 Hyperlipidemia, unspecified: Secondary | ICD-10-CM | POA: Diagnosis not present

## 2017-04-24 DIAGNOSIS — Z9689 Presence of other specified functional implants: Secondary | ICD-10-CM | POA: Diagnosis not present

## 2017-04-24 DIAGNOSIS — Z550 Illiteracy and low-level literacy: Secondary | ICD-10-CM | POA: Insufficient documentation

## 2017-04-28 ENCOUNTER — Ambulatory Visit (HOSPITAL_BASED_OUTPATIENT_CLINIC_OR_DEPARTMENT_OTHER): Payer: Worker's Compensation | Admitting: Certified Registered"

## 2017-04-28 ENCOUNTER — Encounter (HOSPITAL_BASED_OUTPATIENT_CLINIC_OR_DEPARTMENT_OTHER): Payer: Self-pay | Admitting: Anesthesiology

## 2017-04-28 ENCOUNTER — Encounter (HOSPITAL_BASED_OUTPATIENT_CLINIC_OR_DEPARTMENT_OTHER)
Admission: RE | Disposition: A | Discharge status: Home / Self Care | Payer: Self-pay | Source: Ambulatory Visit | Attending: Orthopedic Surgery

## 2017-04-28 ENCOUNTER — Ambulatory Visit (HOSPITAL_BASED_OUTPATIENT_CLINIC_OR_DEPARTMENT_OTHER)
Admission: RE | Admit: 2017-04-28 | Discharge: 2017-04-28 | Disposition: A | Discharge status: Home / Self Care | Payer: Worker's Compensation | Source: Ambulatory Visit | Attending: Orthopedic Surgery | Admitting: Orthopedic Surgery

## 2017-04-28 ENCOUNTER — Ambulatory Visit (HOSPITAL_COMMUNITY): Payer: Worker's Compensation

## 2017-04-28 DIAGNOSIS — Z8719 Personal history of other diseases of the digestive system: Secondary | ICD-10-CM | POA: Diagnosis not present

## 2017-04-28 DIAGNOSIS — F329 Major depressive disorder, single episode, unspecified: Secondary | ICD-10-CM | POA: Insufficient documentation

## 2017-04-28 DIAGNOSIS — I10 Essential (primary) hypertension: Secondary | ICD-10-CM | POA: Diagnosis not present

## 2017-04-28 DIAGNOSIS — Z419 Encounter for procedure for purposes other than remedying health state, unspecified: Secondary | ICD-10-CM

## 2017-04-28 DIAGNOSIS — F419 Anxiety disorder, unspecified: Secondary | ICD-10-CM | POA: Diagnosis not present

## 2017-04-28 DIAGNOSIS — M25532 Pain in left wrist: Secondary | ICD-10-CM | POA: Diagnosis present

## 2017-04-28 DIAGNOSIS — Z87442 Personal history of urinary calculi: Secondary | ICD-10-CM | POA: Diagnosis not present

## 2017-04-28 DIAGNOSIS — F17210 Nicotine dependence, cigarettes, uncomplicated: Secondary | ICD-10-CM | POA: Diagnosis not present

## 2017-04-28 DIAGNOSIS — E785 Hyperlipidemia, unspecified: Secondary | ICD-10-CM | POA: Diagnosis not present

## 2017-04-28 HISTORY — PX: HARDWARE REMOVAL: SHX979

## 2017-04-28 HISTORY — PX: OSTECTOMY: SHX6439

## 2017-04-28 HISTORY — DX: Pain due to internal orthopedic prosthetic devices, implants and grafts, initial encounter: T84.84XA

## 2017-04-28 HISTORY — DX: Personal history of peptic ulcer disease: Z87.11

## 2017-04-28 HISTORY — DX: Hyperlipidemia, unspecified: E78.5

## 2017-04-28 HISTORY — DX: Illiteracy and low-level literacy: Z55.0

## 2017-04-28 HISTORY — DX: Nasal congestion: R09.81

## 2017-04-28 HISTORY — DX: Personal history of other diseases of the digestive system: Z87.19

## 2017-04-28 HISTORY — DX: Personal history of urinary calculi: Z87.442

## 2017-04-28 HISTORY — PX: TENOLYSIS: SHX396

## 2017-04-28 HISTORY — DX: Presence of dental prosthetic device (complete) (partial): Z97.2

## 2017-04-28 SURGERY — OSTECTOMY
Anesthesia: Monitor Anesthesia Care | Site: Wrist | Laterality: Left

## 2017-04-28 MED ORDER — PROMETHAZINE HCL 25 MG/ML IJ SOLN: 6.2500 mg | INTRAMUSCULAR | Status: DC | PRN

## 2017-04-28 MED ORDER — LIDOCAINE HCL 2 % IJ SOLN
INTRAMUSCULAR | Status: AC
  Filled 2017-04-28: qty 20

## 2017-04-28 MED ORDER — LIDOCAINE HCL (PF) 1 % IJ SOLN
INTRAMUSCULAR | Status: AC
  Filled 2017-04-28: qty 30

## 2017-04-28 MED ORDER — DEXAMETHASONE SODIUM PHOSPHATE 10 MG/ML IJ SOLN
INTRAMUSCULAR | Status: AC
  Filled 2017-04-28: qty 1

## 2017-04-28 MED ORDER — FENTANYL CITRATE (PF) 100 MCG/2ML IJ SOLN
INTRAMUSCULAR | Status: AC
  Filled 2017-04-28: qty 2

## 2017-04-28 MED ORDER — CEFAZOLIN SODIUM-DEXTROSE 2-4 GM/100ML-% IV SOLN
2.0000 g | INTRAVENOUS | Status: AC
  Administered 2017-04-28: 2 g via INTRAVENOUS

## 2017-04-28 MED ORDER — BUPIVACAINE-EPINEPHRINE (PF) 0.5% -1:200000 IJ SOLN
INTRAMUSCULAR | Status: AC
  Filled 2017-04-28: qty 30

## 2017-04-28 MED ORDER — FENTANYL CITRATE (PF) 100 MCG/2ML IJ SOLN
25.0000 ug | INTRAMUSCULAR | Status: DC | PRN
  Administered 2017-04-28 (×2): 50 ug via INTRAVENOUS

## 2017-04-28 MED ORDER — ACETAMINOPHEN 500 MG PO TABS
ORAL_TABLET | ORAL | Status: AC
  Filled 2017-04-28: qty 2

## 2017-04-28 MED ORDER — LACTATED RINGERS IV SOLN: INTRAVENOUS | Status: DC

## 2017-04-28 MED ORDER — LACTATED RINGERS IV SOLN
INTRAVENOUS | Status: DC
  Administered 2017-04-28: 12:00:00 via INTRAVENOUS

## 2017-04-28 MED ORDER — LIDOCAINE 2% (20 MG/ML) 5 ML SYRINGE
INTRAMUSCULAR | Status: DC | PRN
  Administered 2017-04-28: 50 mg via INTRAVENOUS

## 2017-04-28 MED ORDER — MIDAZOLAM HCL 2 MG/2ML IJ SOLN
1.0000 mg | INTRAMUSCULAR | Status: DC | PRN
  Administered 2017-04-28 (×2): 1 mg via INTRAVENOUS

## 2017-04-28 MED ORDER — CEFAZOLIN SODIUM 1 G IJ SOLR
INTRAMUSCULAR | Status: AC
  Filled 2017-04-28: qty 20

## 2017-04-28 MED ORDER — OXYCODONE HCL 5 MG PO TABS: 5.0000 mg | ORAL_TABLET | Freq: Four times a day (QID) | ORAL | 0 refills | Status: DC | PRN

## 2017-04-28 MED ORDER — SCOPOLAMINE 1 MG/3DAYS TD PT72: 1.0000 | MEDICATED_PATCH | Freq: Once | TRANSDERMAL | Status: DC | PRN

## 2017-04-28 MED ORDER — PROPOFOL 10 MG/ML IV BOLUS
INTRAVENOUS | Status: DC | PRN
  Administered 2017-04-28: 20 mg via INTRAVENOUS
  Administered 2017-04-28: 50 mg via INTRAVENOUS
  Administered 2017-04-28: 200 mg via INTRAVENOUS
  Administered 2017-04-28 (×2): 20 mg via INTRAVENOUS

## 2017-04-28 MED ORDER — MIDAZOLAM HCL 2 MG/2ML IJ SOLN
INTRAMUSCULAR | Status: AC
  Filled 2017-04-28: qty 2

## 2017-04-28 MED ORDER — ACETAMINOPHEN 325 MG PO TABS: 325.0000 mg | ORAL_TABLET | Freq: Four times a day (QID) | ORAL | Status: AC | PRN

## 2017-04-28 MED ORDER — BUPIVACAINE-EPINEPHRINE 0.5% -1:200000 IJ SOLN
INTRAMUSCULAR | Status: DC | PRN
  Administered 2017-04-28: .5 mL
  Administered 2017-04-28: 4.5 mL
  Administered 2017-04-28: 3 mL

## 2017-04-28 MED ORDER — ACETAMINOPHEN 500 MG PO TABS
1000.0000 mg | ORAL_TABLET | Freq: Once | ORAL | Status: AC
  Administered 2017-04-28: 1000 mg via ORAL

## 2017-04-28 MED ORDER — FENTANYL CITRATE (PF) 100 MCG/2ML IJ SOLN
50.0000 ug | INTRAMUSCULAR | Status: AC | PRN
  Administered 2017-04-28 (×4): 50 ug via INTRAVENOUS

## 2017-04-28 MED ORDER — ONDANSETRON HCL 4 MG/2ML IJ SOLN
INTRAMUSCULAR | Status: AC
  Filled 2017-04-28: qty 2

## 2017-04-28 MED ORDER — LIDOCAINE HCL 2 % IJ SOLN
INTRAMUSCULAR | Status: DC | PRN
  Administered 2017-04-28: .5 mL
  Administered 2017-04-28: 4.5 mL
  Administered 2017-04-28: 3 mL

## 2017-04-28 SURGICAL SUPPLY — 62 items
BANDAGE COBAN STERILE 2 (GAUZE/BANDAGES/DRESSINGS) IMPLANT
BANDAGE ESMARK 6X9 LF (GAUZE/BANDAGES/DRESSINGS) IMPLANT
BLADE MINI RND TIP GREEN BEAV (BLADE) IMPLANT
BLADE SURG 15 STRL LF DISP TIS (BLADE) ×4 IMPLANT
BLADE SURG 15 STRL SS (BLADE) ×8
BNDG CMPR 9X4 STRL LF SNTH (GAUZE/BANDAGES/DRESSINGS) ×2
BNDG CMPR 9X6 STRL LF SNTH (GAUZE/BANDAGES/DRESSINGS)
BNDG COHESIVE 4X5 TAN STRL (GAUZE/BANDAGES/DRESSINGS) ×4 IMPLANT
BNDG ESMARK 4X9 LF (GAUZE/BANDAGES/DRESSINGS) ×2 IMPLANT
BNDG ESMARK 6X9 LF (GAUZE/BANDAGES/DRESSINGS)
BNDG GAUZE 1X2.1 STRL (MISCELLANEOUS) IMPLANT
BNDG GAUZE ELAST 4 BULKY (GAUZE/BANDAGES/DRESSINGS) ×4 IMPLANT
CHLORAPREP W/TINT 26ML (MISCELLANEOUS) ×4 IMPLANT
CORD BIPOLAR FORCEPS 12FT (ELECTRODE) ×6 IMPLANT
COVER BACK TABLE 60X90IN (DRAPES) ×4 IMPLANT
COVER MAYO STAND STRL (DRAPES) ×4 IMPLANT
CUFF TOURNIQUET SINGLE 18IN (TOURNIQUET CUFF) ×2 IMPLANT
CUFF TOURNIQUET SINGLE 34IN LL (TOURNIQUET CUFF) IMPLANT
DRAIN PENROSE 1/2X12 LTX STRL (WOUND CARE) IMPLANT
DRAPE C-ARM 42X72 X-RAY (DRAPES) ×2 IMPLANT
DRAPE EXTREMITY T 121X128X90 (DRAPE) ×4 IMPLANT
DRAPE OEC MINIVIEW 54X84 (DRAPES) IMPLANT
DRAPE SURG 17X23 STRL (DRAPES) ×4 IMPLANT
DRSG EMULSION OIL 3X3 NADH (GAUZE/BANDAGES/DRESSINGS) ×4 IMPLANT
DRSG PAD ABDOMINAL 8X10 ST (GAUZE/BANDAGES/DRESSINGS) IMPLANT
ELECT REM PT RETURN 9FT ADLT (ELECTROSURGICAL)
ELECTRODE REM PT RTRN 9FT ADLT (ELECTROSURGICAL) IMPLANT
GAUZE SPONGE 4X4 12PLY STRL LF (GAUZE/BANDAGES/DRESSINGS) ×4 IMPLANT
GLOVE BIO SURGEON STRL SZ7.5 (GLOVE) ×4 IMPLANT
GLOVE BIOGEL PI IND STRL 7.0 (GLOVE) ×2 IMPLANT
GLOVE BIOGEL PI IND STRL 8 (GLOVE) ×2 IMPLANT
GLOVE BIOGEL PI INDICATOR 7.0 (GLOVE) ×2
GLOVE BIOGEL PI INDICATOR 8 (GLOVE) ×2
GLOVE ECLIPSE 6.5 STRL STRAW (GLOVE) ×4 IMPLANT
GOWN STRL REUS W/ TWL LRG LVL3 (GOWN DISPOSABLE) ×4 IMPLANT
GOWN STRL REUS W/TWL LRG LVL3 (GOWN DISPOSABLE) ×8
GOWN STRL REUS W/TWL XL LVL3 (GOWN DISPOSABLE) ×4 IMPLANT
NDL HYPO 25X1 1.5 SAFETY (NEEDLE) IMPLANT
NEEDLE HYPO 25X1 1.5 SAFETY (NEEDLE) ×8 IMPLANT
NS IRRIG 1000ML POUR BTL (IV SOLUTION) ×4 IMPLANT
PACK BASIN DAY SURGERY FS (CUSTOM PROCEDURE TRAY) ×4 IMPLANT
PADDING CAST ABS 4INX4YD NS (CAST SUPPLIES) ×2
PADDING CAST ABS COTTON 4X4 ST (CAST SUPPLIES) IMPLANT
PENCIL BUTTON HOLSTER BLD 10FT (ELECTRODE) ×2 IMPLANT
RUBBERBAND STERILE (MISCELLANEOUS) IMPLANT
STOCKINETTE 6  STRL (DRAPES) ×2
STOCKINETTE 6 STRL (DRAPES) ×2 IMPLANT
SUCTION FRAZIER HANDLE 10FR (MISCELLANEOUS) ×2
SUCTION TUBE FRAZIER 10FR DISP (MISCELLANEOUS) IMPLANT
SUT ETHILON 3 0 PS 1 (SUTURE) IMPLANT
SUT VIC AB 2-0 PS2 27 (SUTURE) ×2 IMPLANT
SUT VICRYL RAPIDE 4-0 (SUTURE) IMPLANT
SUT VICRYL RAPIDE 4/0 PS 2 (SUTURE) ×2 IMPLANT
SWAB COLLECTION DEVICE MRSA (MISCELLANEOUS) IMPLANT
SWAB CULTURE ESWAB REG 1ML (MISCELLANEOUS) IMPLANT
SYR 10ML LL (SYRINGE) ×4 IMPLANT
SYR BULB 3OZ (MISCELLANEOUS) ×4 IMPLANT
TOWEL OR 17X24 6PK STRL BLUE (TOWEL DISPOSABLE) ×6 IMPLANT
TOWEL OR NON WOVEN STRL DISP B (DISPOSABLE) ×4 IMPLANT
TUBE CONNECTING 20'X1/4 (TUBING) ×1
TUBE CONNECTING 20X1/4 (TUBING) ×1 IMPLANT
UNDERPAD 30X30 (UNDERPADS AND DIAPERS) ×4 IMPLANT

## 2017-04-28 NOTE — Discharge Instructions (Signed)
°  Post Anesthesia Home Care Instructions  Activity: Get plenty of rest for the remainder of the day. A responsible individual must stay with you for 24 hours following the procedure.  For the next 24 hours, DO NOT: -Drive a car -Paediatric nurse -Drink alcoholic beverages -Take any medication unless instructed by your physician -Make any legal decisions or sign important papers.  Meals: Start with liquid foods such as gelatin or soup. Progress to regular foods as tolerated. Avoid greasy, spicy, heavy foods. If nausea and/or vomiting occur, drink only clear liquids until the nausea and/or vomiting subsides. Call your physician if vomiting continues.  Special Instructions/Symptoms: Your throat may feel dry or sore from the anesthesia or the breathing tube placed in your throat during surgery. If this causes discomfort, gargle with warm salt water. The discomfort should disappear within 24 hours.  If you had a scopolamine patch placed behind your ear for the management of post- operative nausea and/or vomiting:  1. The medication in the patch is effective for 72 hours, after which it should be removed.  Wrap patch in a tissue and discard in the trash. Wash hands thoroughly with soap and water. 2. You may remove the patch earlier than 72 hours if you experience unpleasant side effects which may include dry mouth, dizziness or visual disturbances. 3. Avoid touching the patch. Wash your hands with soap and water after contact with the patch.      Discharge Instructions   You have a light dressing on your hand.  You may begin gentle motion of your fingers and hand immediately, but you should not do any heavy lifting or gripping.  Elevate your hand to reduce pain & swelling of the digits.  Ice over the operative site may be helpful to reduce pain & swelling.  DO NOT USE HEAT. Pain medicine has been prescribed for you.  Utilize consistently, but only 325 mg as you already take Tylenol with  your hydrocodone. Leave the dressing in place until you go to therapy.  After the bandage has been removed you may shower, regularly washing the incision and letting the water run over it, but not submerging it (no swimming, soaking it in dishwater, etc.) You may drive a car when you are off of prescription pain medications and can safely control your vehicle with both hands. You may have already made your follow-up appointment when we completed your preop visit.  If not, please call our office today or the next business day to make your return appointment for 10-15 days after surgery.   Please call [phone] during normal business hours or [phone] after hours for any problems. Including the following:  - excessive redness of the incisions - drainage for more than 4 days - fever of more than 101.5 F  *Please note that pain medications will not be refilled after hours or on weekends.  WORK STATUS: OUT OF WORK UNTIL THE PATIENT RETURNS TO [place] FOR POST OPERATIVE APPOINTMENT.

## 2017-04-28 NOTE — Op Note (Signed)
04/28/2017  1:05 PM  PATIENT:  Lucas Ramos  56 y.o. male  PRE-OPERATIVE DIAGNOSIS:  Left wrist pain  POST-OPERATIVE DIAGNOSIS:  Same  PROCEDURE:   1. Left wrist extensor tenolysis, FA, wrist, hand thru dorsal incision    2.  Left wrist partial excision of distal radius    3.  Left distal radius hardware removal thru volar incision  SURGEON: Rayvon Char. Grandville Silos, MD  PHYSICIAN ASSISTANT: Morley Kos, OPA-c  ANESTHESIA:  MAC to general  SPECIMENS:  None  DRAINS:   None  EBL:  less than 50 mL  PREOPERATIVE INDICATIONS:  Lucas Ramos is a  56 y.o. 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  He has developed consistent pain on the dorsal aspect of the wrist somewhat centrally, which appeared to be soft tissues perhaps coursing over bony prominence beneath it.  In addition, excursion of the thumb appeared to be incomplete.  The risks benefits and alternatives were discussed with the patient preoperatively including but not limited to the risks of infection, bleeding, nerve injury, cardiopulmonary complications, the need for revision surgery, among others, and the patient verbalized understanding and consented to proceed.  OPERATIVE IMPLANTS: none  OPERATIVE PROCEDURE:  After receiving prophylactic antibiotics, the patient was escorted to the operative theatre and placed in a supine position.  A surgical "time-out" was performed during which the planned procedure, proposed operative site, and the correct patient identity were compared to the operative consent and agreement confirmed by the circulating nurse according to current facility policy.  The dorsal surgical field was prepared by anesthetizing around it in a U-shaped fashion with a mixture of lidocaine and Marcaine bearing epinephrine.  Following application of a tourniquet to the operative extremity, the exposed skin was prepped with  Chloraprep and draped in the usual sterile fashion.  The limb was exsanguinated with an Esmarch bandage and the tourniquet inflated to approximately 139mHg higher than systolic BP.  He was lightly sedated so that he could participate in the procedure with active motion.  Initially a 2 limb zigzag incision was made sharply using the old scar.  It was ultimately extended to a 3 limb incision.  Subcutaneous tissues were dissected with blunt spreading dissection.  The EPL tendon distally was found to be adherent in scar with incomplete excursion.  It was meticulously dissected proximally.  It was ultimately freed back to the proximal portions of the tendon that were contained within the fourth compartment synovial sheath.  It was at this point left transposed.  It was perhaps slightly long, and was more thin and wispy in the midportion.  This was addressed by suture augmentation, creating some gathering sutures that increase the bulk and shortened the tendon at the same time.  With the thumb extensor having been thus freed and bolstered, attention was directed to the dorsal central aspect of the wrist where an occasional click was identified.  A small dorsal capsulotomy was made in the prominent dorsal rim of the radius was excised with a rongeur.  It was smooth until the clicking went away in the ridge of bone wasn't prominent.  At this point, the wound is copiously irrigated and the tourniquet released.  Additional hemostasis was obtained and the skin was closed with combination 2-0 Vicryl deep dermal buried sutures and a running 4-0 Vicryl Rapide interrupted suture and the skin.  Attention was shifted volarly and the patient was converted to  general anesthetic with an LMA.  Anesthetic was again placed in the region of the incision for postoperative pain control.  A 2 limb zigzag incision was made at the distal aspect of his previous incision and blunt spreading dissection carried down to the pronator quadratus.   3 of the distal pegs were removed and this was confirmed fluoroscopically.  The wound was irrigated, tourniquet released, and skin closed in a similar fashion as the dorsal side.  A short arm thumb spica bulky dressing was applied without plaster component and he was awakened and taken to the recovery room in stable condition, breathing spontaneously.  DISPOSITION: He'll be discharged home today with typical instructions.  He will return to therapy next week to begin range of motion exercises of the wrist and the thumb actively, resting it in a thumb spica splint in a functional position in between exercise sets.  RTC 10-15 days to my office for recheck.  Out of work until returning to see me.

## 2017-04-28 NOTE — Transfer of Care (Signed)
Immediate Anesthesia Transfer of Care Note  Patient: Lucas Ramos  Procedure(s) Performed: Procedure(s) with comments: LEFT DORSAL WRIST EXPLORATION WITH  OSTECTOMY, EXTENSOR TENOLYSIS, AND LEFT VOLAR WRIST HARDWARE REMOVAL (Left) HARDWARE REMOVAL (Left) - CONVERTED TO GENERAL FOR THIS PORTION OF PROCEDURE EXTENSORTENDON SHEATH TENOLYSIS (Left)  Patient Location: PACU  Anesthesia Type:General  Level of Consciousness: awake and alert   Airway & Oxygen Therapy: Patient Spontanous Breathing and Patient connected to face mask oxygen  Post-op Assessment: Report given to RN and Post -op Vital signs reviewed and stable  Post vital signs: Reviewed and stable  Last Vitals:  Vitals:   04/28/17 1126  BP: (!) 141/87  Pulse: (!) 51  Temp: 37.1 C  SpO2: 96%    Last Pain:  Vitals:   04/28/17 1126  TempSrc: Oral  PainSc: 0-No pain         Complications: No apparent anesthesia complications

## 2017-04-28 NOTE — Anesthesia Procedure Notes (Signed)
Procedure Name: MAC Date/Time: 04/28/2017 1:13 PM Performed by: Lieutenant Diego Pre-anesthesia Checklist: Patient identified, Timeout performed, Emergency Drugs available, Suction available and Patient being monitored Patient Re-evaluated:Patient Re-evaluated prior to induction Oxygen Delivery Method: Simple face mask Preoxygenation: Pre-oxygenation with 100% oxygen

## 2017-04-28 NOTE — Anesthesia Preprocedure Evaluation (Addendum)
Anesthesia Evaluation  Patient identified by MRN, date of birth, ID band Patient awake    Reviewed: Allergy & Precautions, NPO status , Patient's Chart, lab work & pertinent test results, reviewed documented beta blocker date and time   History of Anesthesia Complications Negative for: history of anesthetic complications  Airway Mallampati: II  TM Distance: >3 FB Neck ROM: Full    Dental  (+) Lower Dentures, Upper Dentures   Pulmonary Current Smoker,    Pulmonary exam normal breath sounds clear to auscultation       Cardiovascular hypertension, Pt. on medications and Pt. on home beta blockers  Rhythm:Regular Rate:Bradycardia     Neuro/Psych PSYCHIATRIC DISORDERS Anxiety Depression negative neurological ROS     GI/Hepatic Neg liver ROS, PUD,   Endo/Other  Hyperlipidemia   Renal/GU   negative genitourinary   Musculoskeletal Painful hardware left wrist   Abdominal   Peds  Hematology negative hematology ROS (+)   Anesthesia Other Findings   Reproductive/Obstetrics                            Anesthesia Physical Anesthesia Plan  ASA: II  Anesthesia Plan: MAC   Post-op Pain Management:    Induction: Intravenous  PONV Risk Score and Plan: 1 and Ondansetron, Propofol infusion and Midazolam  Airway Management Planned: Simple Face Mask  Additional Equipment:   Intra-op Plan:   Post-operative Plan:   Informed Consent: I have reviewed the patients History and Physical, chart, labs and discussed the procedure including the risks, benefits and alternatives for the proposed anesthesia with the patient or authorized representative who has indicated his/her understanding and acceptance.   Dental advisory given  Plan Discussed with: Anesthesiologist, CRNA and Surgeon  Anesthesia Plan Comments:        Anesthesia Quick Evaluation

## 2017-04-28 NOTE — Interval H&P Note (Signed)
History and Physical Interval Note:  04/28/2017 1:06 PM  Lucas Ramos  has presented today for surgery, with the diagnosis of LEFT WRIST PAINFUL SNAPPING AND PAINFUL HARDWARE T84.498A  The various methods of treatment have been discussed with the patient and family. After consideration of risks, benefits and other options for treatment, the patient has consented to  Procedure(s): LEFT DORSAL WRIST EXPLORATION WITH POSSIBLE OSTECTOMY AND LEFT VOLAR WRIST HARDWARE REMOVAL (Left) HARDWARE REMOVAL (Left) as a surgical intervention .  The patient's history has been reviewed, patient examined, no change in status, stable for surgery.  I have reviewed the patient's chart and labs.  Questions were answered to the patient's satisfaction.     Damoney Julia A.

## 2017-04-29 NOTE — Anesthesia Postprocedure Evaluation (Signed)
Anesthesia Post Note  Patient: Lucas Ramos  Procedure(s) Performed: Procedure(s) (LRB): LEFT DORSAL WRIST EXPLORATION WITH  OSTECTOMY, EXTENSOR TENOLYSIS, AND LEFT VOLAR WRIST HARDWARE REMOVAL (Left) HARDWARE REMOVAL (Left) EXTENSORTENDON SHEATH TENOLYSIS (Left)     Patient location during evaluation: PACU Anesthesia Type: General Level of consciousness: awake and alert Pain management: pain level controlled Vital Signs Assessment: post-procedure vital signs reviewed and stable Respiratory status: spontaneous breathing, nonlabored ventilation, respiratory function stable and patient connected to nasal cannula oxygen Cardiovascular status: blood pressure returned to baseline and stable Postop Assessment: no apparent nausea or vomiting Anesthetic complications: no    Last Vitals:  Vitals:   04/28/17 1515 04/28/17 1546  BP: (!) 147/91 (!) 169/98  Pulse: 64 (!) 57  Resp: 14 16  Temp:  36.7 C  SpO2: 98% 96%    Last Pain:  Vitals:   04/28/17 1546  TempSrc: Oral  PainSc: 2                  Catalina Gravel

## 2017-04-30 ENCOUNTER — Encounter (HOSPITAL_BASED_OUTPATIENT_CLINIC_OR_DEPARTMENT_OTHER): Payer: Self-pay | Admitting: Orthopedic Surgery

## 2017-11-16 ENCOUNTER — Ambulatory Visit (HOSPITAL_COMMUNITY): Payer: Self-pay | Admitting: Hematology

## 2017-11-24 ENCOUNTER — Encounter (HOSPITAL_COMMUNITY): Payer: Self-pay | Admitting: Hematology

## 2017-11-24 ENCOUNTER — Ambulatory Visit (HOSPITAL_COMMUNITY): Payer: Self-pay | Admitting: Hematology

## 2017-11-25 ENCOUNTER — Other Ambulatory Visit: Payer: Self-pay

## 2017-11-25 ENCOUNTER — Encounter (HOSPITAL_COMMUNITY): Payer: Self-pay | Admitting: Hematology

## 2017-11-25 ENCOUNTER — Inpatient Hospital Stay (HOSPITAL_COMMUNITY): Payer: Managed Care, Other (non HMO) | Attending: Hematology | Admitting: Hematology

## 2017-11-25 ENCOUNTER — Encounter: Payer: Self-pay | Admitting: Hematology

## 2017-11-25 VITALS — BP 167/92 | HR 64 | Temp 97.5°F | Resp 18 | Ht 68.0 in | Wt 175.0 lb

## 2017-11-25 DIAGNOSIS — D72828 Other elevated white blood cell count: Secondary | ICD-10-CM

## 2017-11-25 DIAGNOSIS — D75839 Thrombocytosis, unspecified: Secondary | ICD-10-CM

## 2017-11-25 DIAGNOSIS — M542 Cervicalgia: Secondary | ICD-10-CM | POA: Insufficient documentation

## 2017-11-25 DIAGNOSIS — F17210 Nicotine dependence, cigarettes, uncomplicated: Secondary | ICD-10-CM | POA: Diagnosis not present

## 2017-11-25 DIAGNOSIS — I10 Essential (primary) hypertension: Secondary | ICD-10-CM | POA: Diagnosis not present

## 2017-11-25 DIAGNOSIS — D473 Essential (hemorrhagic) thrombocythemia: Secondary | ICD-10-CM

## 2017-11-25 DIAGNOSIS — D72829 Elevated white blood cell count, unspecified: Secondary | ICD-10-CM | POA: Insufficient documentation

## 2017-11-25 DIAGNOSIS — G8929 Other chronic pain: Secondary | ICD-10-CM | POA: Insufficient documentation

## 2017-11-25 DIAGNOSIS — D72821 Monocytosis (symptomatic): Secondary | ICD-10-CM | POA: Insufficient documentation

## 2017-11-25 DIAGNOSIS — F329 Major depressive disorder, single episode, unspecified: Secondary | ICD-10-CM | POA: Insufficient documentation

## 2017-11-25 NOTE — Patient Instructions (Signed)
[facility] Discharge Instructions  Today you saw Dr. K.   Thank you for choosing [facility] at [facility] to provide your oncology and hematology care.  To afford each patient quality time with our provider, please arrive at least 15 minutes before your scheduled appointment time.   If you have a lab appointment with the Cancer Center please come in thru the  Main Entrance and check in at the main information desk  You need to re-schedule your appointment should you arrive 10 or more minutes late.  We strive to give you quality time with our providers, and arriving late affects you and other patients whose appointments are after yours.  Also, if you no show three or more times for appointments you may be dismissed from the clinic at the providers discretion.     Again, thank you for choosing [facility].  Our hope is that these requests will decrease the amount of time that you wait before being seen by our physicians.       _____________________________________________________________  Should you have questions after your visit to [facility], please contact our office at [phone] between the hours of 8:30 a.m. and 4:30 p.m.  Voicemails left after 4:30 p.m. will not be returned until the following business day.  For prescription refill requests, have your pharmacy contact our office.       Resources For Cancer Patients and their Caregivers ? American Cancer Society: Can assist with transportation, wigs, general needs, runs Look Good Feel Better.        1-888-227-6333 ? Cancer Care: Provides financial assistance, online support groups, medication/co-pay assistance.  1-800-813-HOPE (4673) ? [facility] Assists [place] cancer patients and their families through emotional , educational and financial support.  336-427-4357 ? [place] DSS Where to apply for food  stamps, Medicaid and utility assistance. 336-342-1394 ? [facility]: Transportation to medical appointments. 336-347-2287 ? Social Security Administration: May apply for disability if have a Stage IV cancer. 336-342-7796 1-800-772-1213 ? [place] Aging, Disability and Transit Services: Assists with nutrition, care and transit needs. 336-349-2343  Cancer Center Support Programs:   > Cancer Support Group  2nd Tuesday of the month 1pm-2pm, Journey Room   > Creative Journey  3rd Tuesday of the month 1130am-1pm, Journey Room    

## 2017-11-25 NOTE — Progress Notes (Signed)
CONSULT NOTE  Patient Care Team: Redmond School, MD as PCP - General (Internal Medicine)  CHIEF COMPLAINTS/PURPOSE OF CONSULTATION:  Leukocytosis and thrombocythemia  HISTORY OF PRESENTING ILLNESS:  Lucas Ramos 57 y.o. male is here because of leukocytosis and thrombocythemia; referred by his PCP, Dr.Lawrence Fusco.    Here today with his wife; yesterday was their 24th wedding anniversary.   Outside lab results from 10/21/17 revealed WBC 14.4, ANC 9300, absolute lymphocyte count 3400, and absolute monocyte count 1400. Plts 433,000. Hgb and Hct within normal limits.  His elevated WBCs date back to at least 2013, when he was noted to have WBCs 13.5 with neutrophilia and monocytosis noted in 2015 at that time as well.   He is a current smoker; currently smokes 1 ppd x 15 years; stopped for ~20 years, then started smoking again.  Past medical history significant for hyperlipidemia, GERD, depression, chronic pain syndrome, hypertension, neuropathy, chronic anxiety, hypogonadism, and sleep apnea.  He is on IM testosterone replacement monthly. Chronic pain noted to wrist and neck pain, which are chronic.   Denies fevers, night sweats, unintentional weight loss. He had a "crushed wrist" injury, requiring surgery and rehab. Notes sinus infection in past year, but not major injections. No hospitalizations.  No h/o lupus, rheumatoid arthritis, or other connective tissue disorders; no family history of connective tissue disorders, leukemia, or lymphoma.  No recent steroid use reported.   Maternal uncle: pancreatic cancer, died 3 years ago.  Maternal aunt: breast cancer Unknown relative: brain cancer Unknown relative: unknown cancer   Denies headache, diplopia, blurry vision.  Denies rash, LE edema, joint swelling. No h/o DVT.  Does report occasional diarrhea and N&V.   His wife works for [employer] and requests labs be collected at [employer] facility because it is free of charge for patient there.       MEDICAL HISTORY:  Past Medical History:  Diagnosis Date  . Anxiety   . Back pain   . Chronic pain   . Depression   . Esophageal reflux   . Fatigue   . History of gastric ulcer   . History of kidney stones   . Hx of substance abuse    oxycodone   . Hyperlipidemia   . Hypertension    when asked if BP is under control, pt. states "so-so"; has been on med. x 15 yr.  . Hypogonadism in male   . Illiterate    states unable to read and write  . Nasal congestion 04/23/2017  . Neuropathy   . Painful orthopaedic hardware ([place]) 04/2017   left wrist  . Wears dentures    full    SURGICAL HISTORY: Past Surgical History:  Procedure Laterality Date  . CARDIAC CATHETERIZATION  01/02/2005   normal coronaries  . CHOLECYSTECTOMY    . COLONOSCOPY  03/26/2011   Procedure: COLONOSCOPY;  Surgeon: Rogene Houston, MD;  [facility];  Service: Endoscopy;  Laterality: N/A;  . ELBOW SURGERY Bilateral   . ESOPHAGOGASTRODUODENOSCOPY    . FINGER SURGERY Left 2018   thumb  . HARDWARE REMOVAL Left 04/28/2017   Procedure: HARDWARE REMOVAL;  Surgeon: Milly Jakob, MD;  Location: [place];  Service: Orthopedics;  Laterality: Left;  CONVERTED TO GENERAL FOR THIS PORTION OF PROCEDURE  . HIP ARTHROSCOPY  03/08/2012   Procedure: ARTHROSCOPY HIP;  Surgeon: Ninetta Lights, MD;  Location: [place];  Service: Orthopedics;  Laterality: Left;  left hip arthoscopy with debridement   . NASAL  SEPTOPLASTY W/ TURBINOPLASTY    . ORIF WRIST FRACTURE Left 09/2016  . OSTECTOMY Left 04/28/2017   Procedure: LEFT DORSAL WRIST EXPLORATION WITH  OSTECTOMY, EXTENSOR TENOLYSIS, AND LEFT VOLAR WRIST HARDWARE REMOVAL;  Surgeon: Milly Jakob, MD;  Location: [place];  Service: Orthopedics;  Laterality: Left;  . TENOLYSIS Left 04/28/2017   Procedure: EXTENSORTENDON SHEATH TENOLYSIS;  Surgeon: Milly Jakob, MD;  Location: [place];  Service: Orthopedics;   Laterality: Left;  . TONSILLECTOMY  1990s    SOCIAL HISTORY: Social History   Socioeconomic History  . Marital status: Married    Spouse name: Not on file  . Number of children: 1  . Years of education: Not on file  . Highest education level: Not on file  Occupational History  . Not on file  Social Needs  . Financial resource strain: Not on file  . Food insecurity:    Worry: Not on file    Inability: Not on file  . Transportation needs:    Medical: Not on file    Non-medical: Not on file  Tobacco Use  . Smoking status: Current Every Day Smoker    Packs/day: 1.00    Years: 42.00    Pack years: 42.00    Types: Cigarettes  . Smokeless tobacco: Never Used  Substance and Sexual Activity  . Alcohol use: No  . Drug use: No  . Sexual activity: Yes  Lifestyle  . Physical activity:    Days per week: Not on file    Minutes per session: Not on file  . Stress: Not on file  Relationships  . Social connections:    Talks on phone: Not on file    Gets together: Not on file    Attends religious service: Not on file    Active member of club or organization: Not on file    Attends meetings of clubs or organizations: Not on file    Relationship status: Not on file  . Intimate partner violence:    Fear of current or ex partner: Not on file    Emotionally abused: Not on file    Physically abused: Not on file    Forced sexual activity: Not on file  Other Topics Concern  . Not on file  Social History Narrative  . Not on file    FAMILY HISTORY: Family History  Problem Relation Age of Onset  . Cancer Father   . Coronary artery disease Father   . Hypertension Father   . Atrial fibrillation Father   . Pneumonia Mother   . Atrial fibrillation Mother   . Parkinson's disease Mother   . Coronary artery disease Sister   . Atrial fibrillation Sister   . Cancer Maternal Aunt   . Cancer Maternal Uncle   . Cancer Maternal Uncle     ALLERGIES:  is allergic to aspirin; cephalexin;  and zocor [simvastatin].  MEDICATIONS:  Current Outpatient Medications  Medication Sig Dispense Refill  . escitalopram (LEXAPRO) 10 MG tablet Take 10 mg by mouth daily.    Marland Kitchen gabapentin (NEURONTIN) 300 MG capsule Take 300 mg by mouth 3 (three) times daily.    Marland Kitchen acetaminophen (TYLENOL) 325 MG tablet Take 1 tablet (325 mg total) by mouth every 6 (six) hours as needed for mild pain or moderate pain.    . Chlorpheniramine Maleate (ALLERGY RELIEF PO) Take by mouth.    . ezetimibe (ZETIA) 10 MG tablet Take 10 mg by mouth daily.    Marland Kitchen HYDROcodone-acetaminophen (  NORCO) 10-325 MG per tablet Take 1 tablet by mouth 4 (four) times daily.    Marland Kitchen losartan (COZAAR) 100 MG tablet Take 100 mg by mouth daily.    . metoprolol (LOPRESSOR) 50 MG tablet Take 50 mg by mouth daily.    Marland Kitchen oxyCODONE (ROXICODONE) 5 MG immediate release tablet Take 1 tablet (5 mg total) by mouth every 6 (six) hours as needed for breakthrough pain. 10 tablet 0   No current facility-administered medications for this visit.     REVIEW OF SYSTEMS:   Constitutional: Denies fevers, chills or abnormal night sweats Eyes: Denies blurriness of vision, double vision or watery eyes Ears, nose, mouth, throat, and face: Denies mucositis or sore throat Respiratory: Denies cough, dyspnea or wheezes Cardiovascular: Denies palpitation, chest discomfort or lower extremity swelling Gastrointestinal:  Denies nausea, heartburn or change in bowel habits Skin: Denies abnormal skin rashes Lymphatics: Denies new lymphadenopathy or easy bruising Neurological:Denies numbness, tingling or new weaknesses.  Chronic back pain present. Behavioral/Psych: Mood is stable, no new changes  All other systems were reviewed with the patient and are negative.  PHYSICAL EXAMINATION: ECOG PERFORMANCE STATUS: 0 - Asymptomatic  Vitals:   11/25/17 0850  BP: (!) 167/92  Pulse: 64  Resp: 18  Temp: (!) 97.5 F (36.4 C)  SpO2: 99%   Filed Weights   11/25/17 0850  Weight:  175 lb (79.4 kg)    GENERAL:alert, no distress and comfortable SKIN: skin color, texture, turgor are normal, no rashes or significant lesions EYES: normal, conjunctiva are pink and non-injected, sclera clear OROPHARYNX:no exudate, no erythema and lips, buccal mucosa, and tongue normal  NECK: supple, thyroid normal size, non-tender, without nodularity LYMPH:  no palpable lymphadenopathy in the cervical, axillary or inguinal LUNGS: clear to auscultation and percussion with normal breathing effort HEART: regular rate & rhythm and no murmurs and no lower extremity edema ABDOMEN:abdomen soft, non-tender and normal bowel sounds. No hepatosplenomegaly.  Musculoskeletal:no cyanosis of digits and no clubbing  PSYCH: alert & oriented x 3 with fluent speech NEURO: no focal motor/sensory deficits  LABORATORY DATA:  I have reviewed the labs from Dr. Nolon Rod office.  ASSESSMENT & PLAN:  Leukocytosis 1.  Leukocytosis: -I have reviewed his CBC from 2013, 2015 and the latest CBC from March 2019.  He had neutrophilic leukocytosis with slight monocytosis previously.  However the labs from March 2019 shows elevated absolute neutrophil count, absolute lymphocyte count and absolute monocyte count.  Will send his blood for Jak 2, BCR/ABL testing to rule out underlying myeloproliferative disorders.  We will also send for flow cytometry to check for lymphoproliferative disorders.  Will request review of the smear by the pathologist.  We will also check his LDH level and repeat his CBC with differential today.  As per the wife's request, we will send the [facility]. He does not have any B symptoms, palpable adenopathy or splenomegaly.  If the above tests are negative, smoking is most likely the cause of his leukocytosis.  We will see him back in 2-3 weeks for follow-up.  2.  Thrombocytosis: -His platelet count was slightly elevated in 2015 and from March 2019.  Does not have any vasomotor symptoms.  No  headaches were reported.  The above-mentioned tests will reveal if there is any underlying myeloproliferative disorders.  Otherwise this could be considered as reactive.    All questions were answered. The patient knows to call the clinic with any problems, questions or concerns.     Dirk Dress  Delton Coombes, MD 11/25/17 9:39 AM

## 2017-11-25 NOTE — Assessment & Plan Note (Addendum)
1.  Leukocytosis: -I have reviewed his CBC from 2013, 2015 and the latest CBC from March 2019.  He had neutrophilic leukocytosis with slight monocytosis previously.  However the labs from March 2019 shows elevated absolute neutrophil count, absolute lymphocyte count and absolute monocyte count.  Will send his blood for Jak 2, BCR/ABL testing to rule out underlying myeloproliferative disorders.  We will also send for flow cytometry to check for lymphoproliferative disorders.  Will request review of the smear by the pathologist.  We will also check his LDH level and repeat his CBC with differential today.  As per the wife's request, we will send the labs to [facility]. He does not have any B symptoms, palpable adenopathy or splenomegaly.  If the above tests are negative, smoking is most likely the cause of his leukocytosis.  We will see him back in 2-3 weeks for follow-up.  2.  Thrombocytosis: -His platelet count was slightly elevated in 2015 and from March 2019.  Does not have any vasomotor symptoms.  No headaches were reported.  The above-mentioned tests will reveal if there is any underlying myeloproliferative disorders.  Otherwise this could be considered as reactive.

## 2017-12-23 ENCOUNTER — Inpatient Hospital Stay (HOSPITAL_COMMUNITY): Payer: Managed Care, Other (non HMO) | Attending: Hematology | Admitting: Hematology

## 2017-12-23 ENCOUNTER — Encounter (HOSPITAL_COMMUNITY): Payer: Self-pay | Admitting: Hematology

## 2017-12-23 ENCOUNTER — Other Ambulatory Visit: Payer: Self-pay

## 2017-12-23 DIAGNOSIS — D72821 Monocytosis (symptomatic): Secondary | ICD-10-CM | POA: Insufficient documentation

## 2017-12-23 DIAGNOSIS — R197 Diarrhea, unspecified: Secondary | ICD-10-CM | POA: Insufficient documentation

## 2017-12-23 DIAGNOSIS — Z79899 Other long term (current) drug therapy: Secondary | ICD-10-CM | POA: Insufficient documentation

## 2017-12-23 DIAGNOSIS — D72829 Elevated white blood cell count, unspecified: Secondary | ICD-10-CM | POA: Diagnosis not present

## 2017-12-23 DIAGNOSIS — D473 Essential (hemorrhagic) thrombocythemia: Secondary | ICD-10-CM | POA: Diagnosis not present

## 2017-12-23 DIAGNOSIS — F17210 Nicotine dependence, cigarettes, uncomplicated: Secondary | ICD-10-CM | POA: Diagnosis not present

## 2017-12-23 DIAGNOSIS — G8929 Other chronic pain: Secondary | ICD-10-CM | POA: Diagnosis not present

## 2017-12-23 DIAGNOSIS — Z803 Family history of malignant neoplasm of breast: Secondary | ICD-10-CM | POA: Diagnosis not present

## 2017-12-23 NOTE — Progress Notes (Signed)
[place] FOLLOW-UP NOTE  Patient Care Team: Redmond School, MD as PCP - General (Internal Medicine)  CHIEF COMPLAINTS:  Leukocytosis and thrombocythemia  HISTORY OF PRESENTING ILLNESS:  Lucas Ramos 57 y.o. male is here because of leukocytosis and thrombocythemia; referred by his PCP, Dr.Lawrence Fusco.    Here today with his wife; yesterday was their 24th wedding anniversary.   Outside lab results from 10/21/17 revealed WBC 14.4, ANC 9300, absolute lymphocyte count 3400, and absolute monocyte count 1400. Plts 433,000. Hgb and Hct within normal limits.  His elevated WBCs date back to at least 2013, when he was noted to have WBCs 13.5 with neutrophilia and monocytosis noted in 2015 at that time as well.   He is a current smoker; currently smokes 1 ppd x 15 years; stopped for ~20 years, then started smoking again.  Past medical history significant for hyperlipidemia, GERD, depression, chronic pain syndrome, hypertension, neuropathy, chronic anxiety, hypogonadism, and sleep apnea.  He is on IM testosterone replacement monthly. Chronic pain noted to wrist and neck pain, which are chronic.   Denies fevers, night sweats, unintentional weight loss. He had a "crushed wrist" injury, requiring surgery and rehab. Notes sinus infection in past year, but not major injections. No hospitalizations.  No h/o lupus, rheumatoid arthritis, or other connective tissue disorders; no family history of connective tissue disorders, leukemia, or lymphoma.  No recent steroid use reported.   Maternal uncle: pancreatic cancer, died 3 years ago.  Maternal aunt: breast cancer Unknown relative: brain cancer Unknown relative: unknown cancer   Denies headache, diplopia, blurry vision.  Denies rash, LE edema, joint swelling. No h/o DVT.  Does report occasional diarrhea and N&V.   His wife works for [employer] and requests labs be collected at [employer] facility because it is free of charge for patient  there.      INTERVAL HISTORY:  Lucas Ramos 57 y.o. male here for routine follow-up for leukocytosis and thrombocythemia.   Reports intermittent night sweats since last visit.  Labs collected at [facility] reviewed in detail with patient today.  No h/o splenectomy.  FLOW cytometry negative; no blasts.  BCR-ABL negative for CML.  Myeloproliferative disorder testing (JAK2, MPL, CAL-R) all negative.    Discussed recommendation for bone marrow biopsy for further evaluation, given that peripheral blood work-up has been negative.  The procedure for bone marrow biopsy discussed with patient; risks and side effects of procedure discussed with him.  Would like to rule out CMML; this can only be confirmed or ruled out by bone marrow biopsy.  He agrees to proceed.       MEDICAL HISTORY:  Past Medical History:  Diagnosis Date  . Anxiety   . Back pain   . Chronic pain   . Depression   . Esophageal reflux   . Fatigue   . History of gastric ulcer   . History of kidney stones   . Hx of substance abuse    oxycodone   . Hyperlipidemia   . Hypertension    when asked if BP is under control, pt. states "so-so"; has been on med. x 15 yr.  . Hypogonadism in male   . Illiterate    states unable to read and write  . Nasal congestion 04/23/2017  . Neuropathy   . Painful orthopaedic hardware ([place]) 04/2017   left wrist  . Wears dentures    full    SURGICAL HISTORY: Past Surgical History:  Procedure Laterality Date  . CARDIAC CATHETERIZATION  01/02/2005   normal coronaries  . CHOLECYSTECTOMY    . COLONOSCOPY  03/26/2011   Procedure: COLONOSCOPY;  Surgeon: Rogene Houston, MD;  [facility];  Service: Endoscopy;  Laterality: N/A;  . ELBOW SURGERY Bilateral   . ESOPHAGOGASTRODUODENOSCOPY    . FINGER SURGERY Left 2018   thumb  . HARDWARE REMOVAL Left 04/28/2017   Procedure: HARDWARE REMOVAL;  Surgeon: Milly Jakob, MD;  Location: [place];  Service: Orthopedics;   Laterality: Left;  CONVERTED TO GENERAL FOR THIS PORTION OF PROCEDURE  . HIP ARTHROSCOPY  03/08/2012   Procedure: ARTHROSCOPY HIP;  Surgeon: Ninetta Lights, MD;  Location: [place];  Service: Orthopedics;  Laterality: Left;  left hip arthoscopy with debridement   . NASAL SEPTOPLASTY W/ TURBINOPLASTY    . ORIF WRIST FRACTURE Left 09/2016  . OSTECTOMY Left 04/28/2017   Procedure: LEFT DORSAL WRIST EXPLORATION WITH  OSTECTOMY, EXTENSOR TENOLYSIS, AND LEFT VOLAR WRIST HARDWARE REMOVAL;  Surgeon: Milly Jakob, MD;  Location: [place];  Service: Orthopedics;  Laterality: Left;  . TENOLYSIS Left 04/28/2017   Procedure: EXTENSORTENDON SHEATH TENOLYSIS;  Surgeon: Milly Jakob, MD;  Location: [place];  Service: Orthopedics;  Laterality: Left;  . TONSILLECTOMY  1990s    SOCIAL HISTORY: Social History   Socioeconomic History  . Marital status: Married    Spouse name: Not on file  . Number of children: 1  . Years of education: Not on file  . Highest education level: Not on file  Occupational History  . Not on file  Social Needs  . Financial resource strain: Not on file  . Food insecurity:    Worry: Not on file    Inability: Not on file  . Transportation needs:    Medical: Not on file    Non-medical: Not on file  Tobacco Use  . Smoking status: Current Every Day Smoker    Packs/day: 1.00    Years: 42.00    Pack years: 42.00    Types: Cigarettes  . Smokeless tobacco: Never Used  Substance and Sexual Activity  . Alcohol use: No  . Drug use: No  . Sexual activity: Yes  Lifestyle  . Physical activity:    Days per week: Not on file    Minutes per session: Not on file  . Stress: Not on file  Relationships  . Social connections:    Talks on phone: Not on file    Gets together: Not on file    Attends religious service: Not on file    Active member of club or organization: Not on file    Attends meetings of clubs or organizations: Not on file     Relationship status: Not on file  . Intimate partner violence:    Fear of current or ex partner: Not on file    Emotionally abused: Not on file    Physically abused: Not on file    Forced sexual activity: Not on file  Other Topics Concern  . Not on file  Social History Narrative  . Not on file    FAMILY HISTORY: Family History  Problem Relation Age of Onset  . Cancer Father   . Coronary artery disease Father   . Hypertension Father   . Atrial fibrillation Father   . Pneumonia Mother   . Atrial fibrillation Mother   . Parkinson's disease Mother   . Coronary artery disease Sister   . Atrial fibrillation Sister   . Cancer Maternal  Aunt   . Cancer Maternal Uncle   . Cancer Maternal Uncle     ALLERGIES:  is allergic to aspirin; cephalexin; and zocor [simvastatin].  MEDICATIONS:  Current Outpatient Medications  Medication Sig Dispense Refill  . acetaminophen (TYLENOL) 325 MG tablet Take 1 tablet (325 mg total) by mouth every 6 (six) hours as needed for mild pain or moderate pain.    . Chlorpheniramine Maleate (ALLERGY RELIEF PO) Take by mouth.    . escitalopram (LEXAPRO) 10 MG tablet Take 10 mg by mouth daily.    Marland Kitchen ezetimibe (ZETIA) 10 MG tablet Take 10 mg by mouth daily.    Marland Kitchen gabapentin (NEURONTIN) 300 MG capsule Take 300 mg by mouth 3 (three) times daily.    Marland Kitchen HYDROcodone-acetaminophen (NORCO) 10-325 MG per tablet Take 1 tablet by mouth 4 (four) times daily.    Marland Kitchen losartan (COZAAR) 100 MG tablet Take 100 mg by mouth daily.    . metoprolol (LOPRESSOR) 50 MG tablet Take 50 mg by mouth daily.    Marland Kitchen oxyCODONE (ROXICODONE) 5 MG immediate release tablet Take 1 tablet (5 mg total) by mouth every 6 (six) hours as needed for breakthrough pain. 10 tablet 0  . TESTOSTERONE IM Inject into the muscle every 14 (fourteen) days.     No current facility-administered medications for this visit.     REVIEW OF SYSTEMS:   Constitutional: Denies fevers, chills or abnormal night sweats Eyes:  Denies blurriness of vision, double vision or watery eyes Ears, nose, mouth, throat, and face: Denies mucositis or sore throat Respiratory: Denies cough, dyspnea or wheezes Cardiovascular: Denies palpitation, chest discomfort or lower extremity swelling Gastrointestinal:  Denies nausea, heartburn or change in bowel habits Skin: Denies abnormal skin rashes Lymphatics: Denies new lymphadenopathy or easy bruising Neurological:Denies numbness, tingling or new weaknesses.  Chronic back pain present. Behavioral/Psych: Mood is stable, no new changes  All other systems were reviewed with the patient and are negative.  PHYSICAL EXAMINATION: ECOG PERFORMANCE STATUS: 0 - Asymptomatic  I have reviewed his vitals. GENERAL:alert, no distress and comfortable SKIN: skin color, texture, turgor are normal, no rashes or significant lesions EYES: normal, conjunctiva are pink and non-injected, sclera clear   LABORATORY DATA:  I have reviewed the labs from Dr. Nolon Rod office.  ASSESSMENT & PLAN:  Leukocytosis 1.  Leukocytosis: -I have reviewed his CBC from 2013, 2015 and the latest CBC from March 2019.  He had neutrophilic leukocytosis with slight monocytosis previously.  However the labs from March 2019 shows elevated absolute neutrophil count, absolute lymphocyte count and absolute monocyte count. -I have reviewed the results of repeat testing on 11/25/2017.  White count was 20.4, normal hemoglobin, platelet count elevated at 435.  Absolute neutrophil count, absolute lymphocyte count and absolute monocyte count were elevated.  Flow cytometry did not show any significant immunophenotypic abnormality.  BCR/ABL by quantitative PCR was undetectable.  Jak 2 V617F test was negative.  CALR and MPLW were negative.  I have recommended bone marrow aspiration and biopsy to rule out other malignancies like CMML.  If the bone marrow test is normal, we can conclude that this is smoking induced.  He does not have any  palpable adenopathy or splenomegaly.  2.  Thrombocytosis: -His platelet count was slightly elevated in 2015 and from March 2019.  Does not have any vasomotor symptoms.  No headaches were reported.  Repeat platelet count also was high at 435.    All questions were answered. The patient knows to call the clinic  with any problems, questions or concerns.     This note includes documentation from Mike Craze, NP, who was present during this patient's office visit and evaluation.  I have reviewed this note for its completeness and accuracy.  I have edited this note accordingly based on my findings and medical opinion.     Derek Jack, MD 12/23/17 4:34 PM

## 2017-12-23 NOTE — Assessment & Plan Note (Signed)
1.  Leukocytosis: -I have reviewed his CBC from 2013, 2015 and the latest CBC from March 2019.  He had neutrophilic leukocytosis with slight monocytosis previously.  However the labs from March 2019 shows elevated absolute neutrophil count, absolute lymphocyte count and absolute monocyte count. -I have reviewed the results of repeat testing on 11/25/2017.  White count was 20.4, normal hemoglobin, platelet count elevated at 435.  Absolute neutrophil count, absolute lymphocyte count and absolute monocyte count were elevated.  Flow cytometry did not show any significant immunophenotypic abnormality.  BCR/ABL by quantitative PCR was undetectable.  Jak 2 V617F test was negative.  CALR and MPLW were negative.  I have recommended bone marrow aspiration and biopsy to rule out other malignancies like CMML.  If the bone marrow test is normal, we can conclude that this is smoking induced.  He does not have any palpable adenopathy or splenomegaly.  2.  Thrombocytosis: -His platelet count was slightly elevated in 2015 and from March 2019.  Does not have any vasomotor symptoms.  No headaches were reported.  Repeat platelet count also was high at 435.

## 2018-01-20 ENCOUNTER — Encounter (HOSPITAL_COMMUNITY): Payer: Self-pay | Admitting: Hematology

## 2018-01-20 ENCOUNTER — Inpatient Hospital Stay (HOSPITAL_BASED_OUTPATIENT_CLINIC_OR_DEPARTMENT_OTHER): Payer: Managed Care, Other (non HMO)

## 2018-01-20 ENCOUNTER — Inpatient Hospital Stay (HOSPITAL_COMMUNITY): Payer: Managed Care, Other (non HMO) | Attending: Hematology | Admitting: Hematology

## 2018-01-20 VITALS — BP 173/97 | HR 51 | Temp 98.2°F | Resp 18

## 2018-01-20 DIAGNOSIS — Z862 Personal history of diseases of the blood and blood-forming organs and certain disorders involving the immune mechanism: Secondary | ICD-10-CM | POA: Insufficient documentation

## 2018-01-20 DIAGNOSIS — D72829 Elevated white blood cell count, unspecified: Secondary | ICD-10-CM | POA: Insufficient documentation

## 2018-01-20 LAB — CBC WITH DIFFERENTIAL/PLATELET
Basophils Absolute: 0.1 10*3/uL (ref 0.0–0.1)
Basophils Relative: 1 %
EOS ABS: 0.3 10*3/uL (ref 0.0–0.7)
Eosinophils Relative: 2 %
HEMATOCRIT: 47.8 % (ref 39.0–52.0)
Hemoglobin: 16.5 g/dL (ref 13.0–17.0)
LYMPHS ABS: 3.1 10*3/uL (ref 0.7–4.0)
LYMPHS PCT: 28 %
MCH: 32.1 pg (ref 26.0–34.0)
MCHC: 34.5 g/dL (ref 30.0–36.0)
MCV: 93 fL (ref 78.0–100.0)
Monocytes Absolute: 1.2 10*3/uL — ABNORMAL HIGH (ref 0.1–1.0)
Monocytes Relative: 11 %
NEUTROS ABS: 6.6 10*3/uL (ref 1.7–7.7)
NEUTROS PCT: 58 %
Platelets: 385 10*3/uL (ref 150–400)
RBC: 5.14 MIL/uL (ref 4.22–5.81)
RDW: 13.3 % (ref 11.5–15.5)
WBC: 11.3 10*3/uL — AB (ref 4.0–10.5)

## 2018-01-20 MED ORDER — LIDOCAINE HCL (PF) 1 % IJ SOLN
INTRAMUSCULAR | Status: AC
  Filled 2018-01-20: qty 5

## 2018-01-20 NOTE — Progress Notes (Signed)
INDICATION:    Bone Marrow Biopsy and Aspiration Procedure Note   The patient was identified by name and date of birth, prior to start of the procedure and a timeout was performed.   An informed consent was obtained after discussing potential risks including bleeding, infection and pain.  The left posterior iliac crest was palpated, cleaned with ChloraPrep, and drapes applied.  1% lidocaine is infiltrated into the skin, subcutaneous tissue and periosteum.  Bone marrow was aspirated and smears made.  With the help of Jamshidi needle a core biopsy was obtained.  Pressure was applied to the biopsy site and bandage was placed over the biopsy site. Patient was made to lie on the back for 15 mins prior to discharge.  The procedure was tolerated well. COMPLICATIONS: None BLOOD LOSS: none Patient was discharged home in stable condition to return in 2 weeks to review results.  Patient was provided with post bone marrow biopsy instructions and instructed to call if there was any bleeding or worsening pain.  Specimens sent for flow cytometry, cytogenetics and additional studies.  Signed Ravon Mcilhenny, MD   

## 2018-01-20 NOTE — Progress Notes (Signed)
Lucas Ramos, [place] [id number]   CLINIC:  Medical Oncology/Hematology  PCP:  Redmond School, [place] [id number] [phone]   REASON FOR VISIT:  Follow-up for leukocytosis.  CURRENT THERAPY: Observation.   INTERVAL HISTORY:  Lucas Ramos 57 y.o. male returns for follow-up of white count and bone marrow biopsy.  Denies any recent infections.  Denies any hospitalizations.  Continuing to work full-time job.   REVIEW OF SYSTEMS:  Review of Systems  All other systems reviewed and are negative.    PAST MEDICAL/SURGICAL HISTORY:  Past Medical History:  Diagnosis Date  . Anxiety   . Back pain   . Chronic pain   . Depression   . Esophageal reflux   . Fatigue   . History of gastric ulcer   . History of kidney stones   . Hx of substance abuse    oxycodone   . Hyperlipidemia   . Hypertension    when asked if BP is under control, pt. states "so-so"; has been on med. x 15 yr.  . Hypogonadism in male   . Illiterate    states unable to read and write  . Nasal congestion 04/23/2017  . Neuropathy   . Painful orthopaedic hardware ([place]) 04/2017   left wrist  . Wears dentures    full   Past Surgical History:  Procedure Laterality Date  . CARDIAC CATHETERIZATION  01/02/2005   normal coronaries  . CHOLECYSTECTOMY    . COLONOSCOPY  03/26/2011   Procedure: COLONOSCOPY;  Surgeon: Rogene Houston, MD;  [facility];  Service: Endoscopy;  Laterality: N/A;  . ELBOW SURGERY Bilateral   . ESOPHAGOGASTRODUODENOSCOPY    . FINGER SURGERY Left 2018   thumb  . HARDWARE REMOVAL Left 04/28/2017   Procedure: HARDWARE REMOVAL;  Surgeon: Milly Jakob, MD;  Location: [place];  Service: Orthopedics;  Laterality: Left;  CONVERTED TO GENERAL FOR THIS PORTION OF PROCEDURE  . HIP ARTHROSCOPY  03/08/2012   Procedure: ARTHROSCOPY HIP;  Surgeon: Ninetta Lights, MD;  Location: [facility];  Service: Orthopedics;   Laterality: Left;  left hip arthoscopy with debridement   . NASAL SEPTOPLASTY W/ TURBINOPLASTY    . ORIF WRIST FRACTURE Left 09/2016  . OSTECTOMY Left 04/28/2017   Procedure: LEFT DORSAL WRIST EXPLORATION WITH  OSTECTOMY, EXTENSOR TENOLYSIS, AND LEFT VOLAR WRIST HARDWARE REMOVAL;  Surgeon: Milly Jakob, MD;  Location: [place];  Service: Orthopedics;  Laterality: Left;  . TENOLYSIS Left 04/28/2017   Procedure: EXTENSORTENDON SHEATH TENOLYSIS;  Surgeon: Milly Jakob, MD;  Location: [place];  Service: Orthopedics;  Laterality: Left;  . TONSILLECTOMY  1990s     SOCIAL HISTORY:  Social History   Socioeconomic History  . Marital status: Married    Spouse name: Not on file  . Number of children: 1  . Years of education: Not on file  . Highest education level: Not on file  Occupational History  . Not on file  Social Needs  . Financial resource strain: Not on file  . Food insecurity:    Worry: Not on file    Inability: Not on file  . Transportation needs:    Medical: Not on file    Non-medical: Not on file  Tobacco Use  . Smoking status: Current Every Day Smoker    Packs/day: 1.00    Years: 42.00    Pack years: 42.00    Types: Cigarettes  .  Smokeless tobacco: Never Used  Substance and Sexual Activity  . Alcohol use: No  . Drug use: No  . Sexual activity: Yes  Lifestyle  . Physical activity:    Days per week: Not on file    Minutes per session: Not on file  . Stress: Not on file  Relationships  . Social connections:    Talks on phone: Not on file    Gets together: Not on file    Attends religious service: Not on file    Active member of club or organization: Not on file    Attends meetings of clubs or organizations: Not on file    Relationship status: Not on file  . Intimate partner violence:    Fear of current or ex partner: Not on file    Emotionally abused: Not on file    Physically abused: Not on file    Forced sexual  activity: Not on file  Other Topics Concern  . Not on file  Social History Narrative  . Not on file    FAMILY HISTORY:  Family History  Problem Relation Age of Onset  . Cancer Father   . Coronary artery disease Father   . Hypertension Father   . Atrial fibrillation Father   . Pneumonia Mother   . Atrial fibrillation Mother   . Parkinson's disease Mother   . Coronary artery disease Sister   . Atrial fibrillation Sister   . Cancer Maternal Aunt   . Cancer Maternal Uncle   . Cancer Maternal Uncle     CURRENT MEDICATIONS:  Outpatient Encounter Medications as of 01/20/2018  Medication Sig  . acetaminophen (TYLENOL) 325 MG tablet Take 1 tablet (325 mg total) by mouth every 6 (six) hours as needed for mild pain or moderate pain.  . Chlorpheniramine Maleate (ALLERGY RELIEF PO) Take by mouth.  . escitalopram (LEXAPRO) 10 MG tablet Take 10 mg by mouth daily.  Marland Kitchen ezetimibe (ZETIA) 10 MG tablet Take 10 mg by mouth daily.  Marland Kitchen gabapentin (NEURONTIN) 300 MG capsule Take 300 mg by mouth 3 (three) times daily.  Marland Kitchen HYDROcodone-acetaminophen (NORCO) 10-325 MG per tablet Take 1 tablet by mouth 4 (four) times daily.  Marland Kitchen losartan (COZAAR) 100 MG tablet Take 100 mg by mouth daily.  . metoprolol (LOPRESSOR) 50 MG tablet Take 50 mg by mouth daily.  Marland Kitchen oxyCODONE (ROXICODONE) 5 MG immediate release tablet Take 1 tablet (5 mg total) by mouth every 6 (six) hours as needed for breakthrough pain.  . TESTOSTERONE IM Inject into the muscle every 14 (fourteen) days.  Marland Kitchen lidocaine (PF) (XYLOCAINE) 1 % injection    No facility-administered encounter medications on file as of 01/20/2018.     ALLERGIES:  Allergies  Allergen Reactions  . Aspirin Other (See Comments)    GI ulcers  . Cephalexin   . Zocor [Simvastatin]     polymyalgia     PHYSICAL EXAM:  ECOG Performance status: 0 We have reviewed his vitals.  Physical Exam Deferred. LABORATORY DATA:  I have reviewed the labs as listed.  CBC      Component Value Date/Time   WBC 11.3 (H) 01/20/2018 0831   RBC 5.14 01/20/2018 0831   HGB 16.5 01/20/2018 0831   HCT 47.8 01/20/2018 0831   PLT 385 01/20/2018 0831   MCV 93.0 01/20/2018 0831   MCH 32.1 01/20/2018 0831   MCHC 34.5 01/20/2018 0831   RDW 13.3 01/20/2018 0831   LYMPHSABS 3.1 01/20/2018 0831   MONOABS 1.2 (H) 01/20/2018  0831   EOSABS 0.3 01/20/2018 0831   BASOSABS 0.1 01/20/2018 0831   CMP Latest Ref Rng & Units 11/17/2013 03/04/2012  Glucose 70 - 99 mg/dL 88 106(H)  BUN 6 - 23 mg/dL 12 13  Creatinine 0.50 - 1.35 mg/dL 0.80 0.82  Sodium 137 - 147 mEq/L 138 139  Potassium 3.7 - 5.3 mEq/L 3.5(L) 3.8  Chloride 96 - 112 mEq/L 99 99  CO2 19 - 32 mEq/L 26 32  Calcium 8.4 - 10.5 mg/dL 9.4 9.4  Total Protein 6.0 - 8.3 g/dL 7.1 6.9  Total Bilirubin 0.3 - 1.2 mg/dL 0.5 0.3  Alkaline Phos 39 - 117 U/L 99 77  AST 0 - 37 U/L 25 23  ALT 0 - 53 U/L 17 17        ASSESSMENT & PLAN:   Leukocytosis 1.  Leukocytosis: -I have reviewed his CBC from 2013, 2015 and the latest CBC from March 2019.  He had neutrophilic leukocytosis with slight monocytosis previously.  However the labs from March 2019 shows elevated absolute neutrophil count, absolute lymphocyte count and absolute monocyte count. -Repeat testing on 11/25/2017 shows white count was 20.4, normal hemoglobin, platelet count elevated at 435.  Absolute neutrophil count, absolute lymphocyte count and absolute monocyte count were elevated.  Flow cytometry did not show any significant immunophenotypic abnormality.  BCR/ABL by quantitative PCR was undetectable.  Jak 2 V617F test was negative.  CALR and MPLW were negative.  He does not have any palpable adenopathy or splenomegaly.  Bone marrow was recommended to rule out CMML.  We talked about the procedure and associated risks in detail.  We will proceed with bone marrow aspiration biopsy today.  2.  Thrombocytosis: -His platelet count was slightly elevated in 2015 and from March  2019.  Does not have any vasomotor symptoms.  No headaches were reported.  Repeat platelet count also was high at 435.      Orders placed this encounter:  No orders of the defined types were placed in this encounter.     Derek Jack, MD [place] [phone]

## 2018-01-20 NOTE — Progress Notes (Signed)
Lucas Ramos tolerated bone marrow biopsy by Dr. Delton Coombes well without complaints or incident. 0820 Consent signed and procedure instructions reviewed with pt and wife who verbalized understanding  0847 Time out procedure completed 0850 Pt positioned and procedure started.0901 Procedure completed and dressing clean,dry and intact to right hip.Pt repositioned. VSS Pt denied any complaints with wife at bedside.0936 Dressing to bone marrow site clean,dry and intact. Pt denies any issues including pain at the site. VSS B/P 173/97 which is normal for him and he did take his B/P medication this morning Pt discharged self ambulatory in satisfactory condition accompanied by his wife

## 2018-01-20 NOTE — Patient Instructions (Signed)
[place] at [facility] Discharge Instructions  Bone marrow biopsy performed by Dr. Delton Coombes today.Written after care instructions for bone marrow biopsy reviewed with and given to pt . Follow-up as scheduled. Call clinic for any questions or concerns    Thank you for choosing [place] at [facility] to provide your oncology and hematology care.  To afford each patient quality time with our provider, please arrive at least 15 minutes before your scheduled appointment time.   If you have a lab appointment with the [facility] please come in thru the  Main Entrance and check in at the main information desk  You need to re-schedule your appointment should you arrive 10 or more minutes late.  We strive to give you quality time with our providers, and arriving late affects you and other patients whose appointments are after yours.  Also, if you no show three or more times for appointments you may be dismissed from the clinic at the providers discretion.     Again, thank you for choosing [facility].  Our hope is that these requests will decrease the amount of time that you wait before being seen by our physicians.       _____________________________________________________________  Should you have questions after your visit to [facility], please contact our office at [phone] between the hours of 8:30 a.m. and 4:30 p.m.  Voicemails left after 4:30 p.m. will not be returned until the following business day.  For prescription refill requests, have your pharmacy contact our office.       Resources For Cancer Patients and their Caregivers ? American Cancer Society: Can assist with transportation, wigs, general needs, runs Look Good Feel Better.        (907)657-8859 ? Cancer Care: Provides financial assistance, online support groups, medication/co-pay assistance.  1-800-813-HOPE 725-685-6512) ? [place] Assists [place] cancer patients and their families through emotional , educational and financial support.  223-701-9152 ? [place] DSS Where to apply for food stamps, Medicaid and utility assistance. (234)529-3317 ? [facility]: Transportation to medical appointments. 803-029-2876 ? Social Security Administration: May apply for disability if have a Stage IV cancer. 480-654-6920 629-583-8964 ? [employer], Disability and Transit Services: Assists with nutrition, care and transit needs. [place] Support Programs:   > Cancer Support Group  2nd Tuesday of the month 1pm-2pm, Journey Room   > Creative Journey  3rd Tuesday of the month 1130am-1pm, Journey Room

## 2018-01-20 NOTE — Assessment & Plan Note (Addendum)
1.  Leukocytosis: -I have reviewed his CBC from 2013, 2015 and the latest CBC from March 2019.  He had neutrophilic leukocytosis with slight monocytosis previously.  However the labs from March 2019 shows elevated absolute neutrophil count, absolute lymphocyte count and absolute monocyte count. -Repeat testing on 11/25/2017 shows white count was 20.4, normal hemoglobin, platelet count elevated at 435.  Absolute neutrophil count, absolute lymphocyte count and absolute monocyte count were elevated.  Flow cytometry did not show any significant immunophenotypic abnormality.  BCR/ABL by quantitative PCR was undetectable.  Jak 2 V617F test was negative.  CALR and MPLW were negative.  He does not have any palpable adenopathy or splenomegaly.  Bone marrow was recommended to rule out CMML.  We talked about the procedure and associated risks in detail.  We will proceed with bone marrow aspiration biopsy today.  2.  Thrombocytosis: -His platelet count was slightly elevated in 2015 and from March 2019.  Does not have any vasomotor symptoms.  No headaches were reported.  Repeat platelet count also was high at 435.

## 2018-01-28 ENCOUNTER — Encounter (HOSPITAL_COMMUNITY): Payer: Self-pay | Admitting: Hematology

## 2018-02-03 ENCOUNTER — Encounter (HOSPITAL_COMMUNITY): Payer: Self-pay | Admitting: Hematology

## 2018-02-03 ENCOUNTER — Inpatient Hospital Stay (HOSPITAL_COMMUNITY): Payer: Managed Care, Other (non HMO) | Attending: Hematology | Admitting: Hematology

## 2018-02-03 VITALS — BP 152/87 | HR 74 | Temp 98.1°F | Resp 14 | Wt 169.6 lb

## 2018-02-03 DIAGNOSIS — D72829 Elevated white blood cell count, unspecified: Secondary | ICD-10-CM | POA: Diagnosis not present

## 2018-02-03 DIAGNOSIS — F17210 Nicotine dependence, cigarettes, uncomplicated: Secondary | ICD-10-CM | POA: Insufficient documentation

## 2018-02-03 NOTE — Patient Instructions (Signed)
[facility] at [facility] Discharge Instructions  You saw Dr. Katragadda today.   Thank you for choosing [facility] at [facility] to provide your oncology and hematology care.  To afford each patient quality time with our provider, please arrive at least 15 minutes before your scheduled appointment time.   If you have a lab appointment with the Cancer Center please come in thru the  Main Entrance and check in at the main information desk  You need to re-schedule your appointment should you arrive 10 or more minutes late.  We strive to give you quality time with our providers, and arriving late affects you and other patients whose appointments are after yours.  Also, if you no show three or more times for appointments you may be dismissed from the clinic at the providers discretion.     Again, thank you for choosing [facility].  Our hope is that these requests will decrease the amount of time that you wait before being seen by our physicians.       _____________________________________________________________  Should you have questions after your visit to [facility], please contact our office at [phone] between the hours of 8:30 a.m. and 4:30 p.m.  Voicemails left after 4:30 p.m. will not be returned until the following business day.  For prescription refill requests, have your pharmacy contact our office.       Resources For Cancer Patients and their Caregivers ? American Cancer Society: Can assist with transportation, wigs, general needs, runs Look Good Feel Better.        1-888-227-6333 ? Cancer Care: Provides financial assistance, online support groups, medication/co-pay assistance.  1-800-813-HOPE (4673) ? [facility] Assists [place] cancer patients and their families through emotional , educational and financial support.  336-427-4357 ? [place] DSS Where to apply for  food stamps, Medicaid and utility assistance. 336-342-1394 ? [facility]: Transportation to medical appointments. 336-347-2287 ? Social Security Administration: May apply for disability if have a Stage IV cancer. 336-342-7796 1-800-772-1213 ? [place] Aging, Disability and Transit Services: Assists with nutrition, care and transit needs. 336-349-2343  Cancer Center Support Programs:   > Cancer Support Group  2nd Tuesday of the month 1pm-2pm, Journey Room   > Creative Journey  3rd Tuesday of the month 1130am-1pm, Journey Room     

## 2018-02-03 NOTE — Progress Notes (Signed)
[place], [place] [address]   CLINIC:  Medical Oncology/Hematology  PCP:  [facility], [place] [address] [phone]   REASON FOR VISIT:  Follow-up for leukocytosis  CURRENT THERAPY: observation  INTERVAL HISTORY:  [name] 57 y.o. male returns for routine follow-up for his leukocytosis. Patient had a bone marrow biopsy done last visit without complications. Overall, he tells me he has been feeling pretty well.  He is continuing to smoke every day.  He works full-time job.  Denies any major fatigue.  Energy levels are described as over 50%.  Denies any fevers or infections.      REVIEW OF SYSTEMS:  Review of Systems  Constitutional: Positive for chills and fatigue.  Neurological: Positive for extremity weakness.  Psychiatric/Behavioral: Positive for sleep disturbance.  All other systems reviewed and are negative.    PAST MEDICAL/SURGICAL HISTORY:  Past Medical History:  Diagnosis Date  . Anxiety   . Back pain   . Chronic pain   . Depression   . Esophageal reflux   . Fatigue   . History of gastric ulcer   . History of kidney stones   . Hx of substance abuse    oxycodone   . Hyperlipidemia   . Hypertension    when asked if BP is under control, pt. states "so-so"; has been on med. x 15 yr.  . Hypogonadism in male   . Illiterate    states unable to read and write  . Nasal congestion 04/23/2017  . Neuropathy   . Painful orthopaedic hardware ([place]) 04/2017   left wrist  . Wears dentures    full   Past Surgical History:  Procedure Laterality Date  . CARDIAC CATHETERIZATION  01/02/2005   normal coronaries  . CHOLECYSTECTOMY    . COLONOSCOPY  03/26/2011   Procedure: COLONOSCOPY;  Surgeon: Rogene Houston, MD;  [facility];  Service: Endoscopy;  Laterality: N/A;  . ELBOW SURGERY Bilateral   . ESOPHAGOGASTRODUODENOSCOPY    . FINGER SURGERY Left 2018   thumb  . HARDWARE  REMOVAL Left 04/28/2017   Procedure: HARDWARE REMOVAL;  Surgeon: Milly Jakob, MD;  Location: [place];  Service: Orthopedics;  Laterality: Left;  CONVERTED TO GENERAL FOR THIS PORTION OF PROCEDURE  . HIP ARTHROSCOPY  03/08/2012   Procedure: ARTHROSCOPY HIP;  Surgeon: Ninetta Lights, MD;  Location: [place];  Service: Orthopedics;  Laterality: Left;  left hip arthoscopy with debridement   . NASAL SEPTOPLASTY W/ TURBINOPLASTY    . ORIF WRIST FRACTURE Left 09/2016  . OSTECTOMY Left 04/28/2017   Procedure: LEFT DORSAL WRIST EXPLORATION WITH  OSTECTOMY, EXTENSOR TENOLYSIS, AND LEFT VOLAR WRIST HARDWARE REMOVAL;  Surgeon: Milly Jakob, MD;  Location: [place];  Service: Orthopedics;  Laterality: Left;  . TENOLYSIS Left 04/28/2017   Procedure: EXTENSORTENDON SHEATH TENOLYSIS;  Surgeon: Milly Jakob, MD;  Location: [place];  Service: Orthopedics;  Laterality: Left;  . TONSILLECTOMY  1990s     SOCIAL HISTORY:  Social History   Socioeconomic History  . Marital status: Married    Spouse name: Not on file  . Number of children: 1  . Years of education: Not on file  . Highest education level: Not on file  Occupational History  . Not on file  Social Needs  . Financial resource strain: Not on file  . Food insecurity:    Worry: Not on file  Inability: Not on file  . Transportation needs:    Medical: Not on file    Non-medical: Not on file  Tobacco Use  . Smoking status: Current Every Day Smoker    Packs/day: 1.00    Years: 42.00    Pack years: 42.00    Types: Cigarettes  . Smokeless tobacco: Never Used  Substance and Sexual Activity  . Alcohol use: No  . Drug use: No  . Sexual activity: Yes  Lifestyle  . Physical activity:    Days per week: Not on file    Minutes per session: Not on file  . Stress: Not on file  Relationships  . Social connections:    Talks on phone: Not on file    Gets together: Not on file    Attends  religious service: Not on file    Active member of club or organization: Not on file    Attends meetings of clubs or organizations: Not on file    Relationship status: Not on file  . Intimate partner violence:    Fear of current or ex partner: Not on file    Emotionally abused: Not on file    Physically abused: Not on file    Forced sexual activity: Not on file  Other Topics Concern  . Not on file  Social History Narrative  . Not on file    FAMILY HISTORY:  Family History  Problem Relation Age of Onset  . Cancer Father   . Coronary artery disease Father   . Hypertension Father   . Atrial fibrillation Father   . Pneumonia Mother   . Atrial fibrillation Mother   . Parkinson's disease Mother   . Coronary artery disease Sister   . Atrial fibrillation Sister   . Cancer Maternal Aunt   . Cancer Maternal Uncle   . Cancer Maternal Uncle     CURRENT MEDICATIONS:  Outpatient Encounter Medications as of 02/03/2018  Medication Sig  . acetaminophen (TYLENOL) 325 MG tablet Take 1 tablet (325 mg total) by mouth every 6 (six) hours as needed for mild pain or moderate pain.  . Chlorpheniramine Maleate (ALLERGY RELIEF PO) Take by mouth.  . escitalopram (LEXAPRO) 10 MG tablet Take 10 mg by mouth daily.  Marland Kitchen ezetimibe (ZETIA) 10 MG tablet Take 10 mg by mouth daily.  Marland Kitchen gabapentin (NEURONTIN) 300 MG capsule Take 300 mg by mouth 3 (three) times daily.  Marland Kitchen HYDROcodone-acetaminophen (NORCO) 10-325 MG per tablet Take 1 tablet by mouth 4 (four) times daily.  Marland Kitchen losartan (COZAAR) 100 MG tablet Take 100 mg by mouth daily.  . metoprolol (LOPRESSOR) 50 MG tablet Take 50 mg by mouth daily.  Marland Kitchen oxyCODONE (ROXICODONE) 5 MG immediate release tablet Take 1 tablet (5 mg total) by mouth every 6 (six) hours as needed for breakthrough pain.  . TESTOSTERONE IM Inject into the muscle every 14 (fourteen) days.   No facility-administered encounter medications on file as of 02/03/2018.     ALLERGIES:  Allergies    Allergen Reactions  . Aspirin Other (See Comments)    GI ulcers  . Cephalexin   . Zocor [Simvastatin]     polymyalgia     PHYSICAL EXAM:  ECOG Performance status: 0  Vitals:   02/03/18 1423  BP: (!) 152/87  Pulse: 74  Resp: 14  Temp: 98.1 F (36.7 C)  SpO2: 97%   Filed Weights   02/03/18 1423  Weight: 169 lb 9.6 oz (76.9 kg)    Physical Exam  LABORATORY DATA:  I have reviewed the labs as listed.  CBC    Component Value Date/Time   WBC 11.3 (H) 01/20/2018 0831   RBC 5.14 01/20/2018 0831   HGB 16.5 01/20/2018 0831   HCT 47.8 01/20/2018 0831   PLT 385 01/20/2018 0831   MCV 93.0 01/20/2018 0831   MCH 32.1 01/20/2018 0831   MCHC 34.5 01/20/2018 0831   RDW 13.3 01/20/2018 0831   LYMPHSABS 3.1 01/20/2018 0831   MONOABS 1.2 (H) 01/20/2018 0831   EOSABS 0.3 01/20/2018 0831   BASOSABS 0.1 01/20/2018 0831   CMP Latest Ref Rng & Units 11/17/2013 03/04/2012  Glucose 70 - 99 mg/dL 88 106(H)  BUN 6 - 23 mg/dL 12 13  Creatinine 0.50 - 1.35 mg/dL 0.80 0.82  Sodium 137 - 147 mEq/L 138 139  Potassium 3.7 - 5.3 mEq/L 3.5(L) 3.8  Chloride 96 - 112 mEq/L 99 99  CO2 19 - 32 mEq/L 26 32  Calcium 8.4 - 10.5 mg/dL 9.4 9.4  Total Protein 6.0 - 8.3 g/dL 7.1 6.9  Total Bilirubin 0.3 - 1.2 mg/dL 0.5 0.3  Alkaline Phos 39 - 117 U/L 99 77  AST 0 - 37 U/L 25 23  ALT 0 - 53 U/L 17 17        ASSESSMENT & PLAN:   Leukocytosis 1.  Leukocytosis: -His CBC from 2013, 2015 and the latest CBC from March 2019.  He had neutrophilic leukocytosis with slight monocytosis previously.  However the labs from March 2019 shows elevated absolute neutrophil count, absolute lymphocyte count and absolute monocyte count. -Repeat testing on 11/25/2017 shows white count was 20.4, normal hemoglobin, platelet count elevated at 435.  Absolute neutrophil count, absolute lymphocyte count and absolute monocyte count were elevated.  Flow cytometry did not show any significant immunophenotypic abnormality.   BCR/ABL by quantitative PCR was undetectable.  Jak 2 V617F test was negative.  CALR and MPLW were negative.  He does not have any palpable adenopathy or splenomegaly.  Bone marrow was recommended to rule out CMML.  -I have discussed the bone marrow biopsy results with the patient and his wife in detail.  It was done on 01/20/2018 which showed trilineage hematopoiesis with no abnormalities including dysplasia's or leukemias.  Chromosome analysis shows 68, XY.  At this time it can be safely resumed that his leukocytosis is from smoking cigarettes.  His white count on the day of bone marrow biopsy has come down to 11.3.  We discussed that both husband and wife should quit smoking at the same time.  We will see him back in 6 months for follow-up.  If there is no significant change in the blood counts, we will consider yearly follow-up.  2.  Thrombocytosis: -His platelet count was slightly elevated in 2015 and from March 2019.  Does not have any vasomotor symptoms.  No headaches were reported.  Repeat platelet count also was high at 435.  Subsequent platelet count on 01/20/2018 was normal.      Orders placed this encounter:  Orders Placed This Encounter  Procedures  . CBC with Differential/Platelet  . Comprehensive metabolic panel  . Lactate dehydrogenase      Derek Jack, MD [place] [phone]

## 2018-02-03 NOTE — Assessment & Plan Note (Signed)
1.  Leukocytosis: -His CBC from 2013, 2015 and the latest CBC from March 2019.  He had neutrophilic leukocytosis with slight monocytosis previously.  However the labs from March 2019 shows elevated absolute neutrophil count, absolute lymphocyte count and absolute monocyte count. -Repeat testing on 11/25/2017 shows white count was 20.4, normal hemoglobin, platelet count elevated at 435.  Absolute neutrophil count, absolute lymphocyte count and absolute monocyte count were elevated.  Flow cytometry did not show any significant immunophenotypic abnormality.  BCR/ABL by quantitative PCR was undetectable.  Jak 2 V617F test was negative.  CALR and MPLW were negative.  He does not have any palpable adenopathy or splenomegaly.  Bone marrow was recommended to rule out CMML.  -I have discussed the bone marrow biopsy results with the patient and his wife in detail.  It was done on 01/20/2018 which showed trilineage hematopoiesis with no abnormalities including dysplasia's or leukemias.  Chromosome analysis shows 71, XY.  At this time it can be safely resumed that his leukocytosis is from smoking cigarettes.  His white count on the day of bone marrow biopsy has come down to 11.3.  We discussed that both husband and wife should quit smoking at the same time.  We will see him back in 6 months for follow-up.  If there is no significant change in the blood counts, we will consider yearly follow-up.  2.  Thrombocytosis: -His platelet count was slightly elevated in 2015 and from March 2019.  Does not have any vasomotor symptoms.  No headaches were reported.  Repeat platelet count also was high at 435.  Subsequent platelet count on 01/20/2018 was normal.

## 2018-07-07 ENCOUNTER — Other Ambulatory Visit (HOSPITAL_COMMUNITY): Payer: Self-pay

## 2018-07-08 ENCOUNTER — Ambulatory Visit (HOSPITAL_COMMUNITY): Payer: Self-pay | Admitting: Hematology

## 2018-08-06 ENCOUNTER — Other Ambulatory Visit (HOSPITAL_COMMUNITY): Payer: Self-pay

## 2018-08-09 ENCOUNTER — Ambulatory Visit (HOSPITAL_COMMUNITY): Payer: Self-pay | Admitting: Hematology

## 2018-09-02 ENCOUNTER — Encounter: Payer: Self-pay | Admitting: Hematology

## 2018-09-02 ENCOUNTER — Other Ambulatory Visit: Payer: Self-pay

## 2018-09-02 ENCOUNTER — Encounter (HOSPITAL_COMMUNITY): Payer: Self-pay | Admitting: Hematology

## 2018-09-02 ENCOUNTER — Inpatient Hospital Stay (HOSPITAL_COMMUNITY): Payer: 59 | Attending: Hematology | Admitting: Hematology

## 2018-09-02 DIAGNOSIS — F17210 Nicotine dependence, cigarettes, uncomplicated: Secondary | ICD-10-CM

## 2018-09-02 DIAGNOSIS — D473 Essential (hemorrhagic) thrombocythemia: Secondary | ICD-10-CM | POA: Diagnosis not present

## 2018-09-02 DIAGNOSIS — D72829 Elevated white blood cell count, unspecified: Secondary | ICD-10-CM | POA: Insufficient documentation

## 2018-09-02 DIAGNOSIS — I10 Essential (primary) hypertension: Secondary | ICD-10-CM

## 2018-09-02 DIAGNOSIS — Z79899 Other long term (current) drug therapy: Secondary | ICD-10-CM | POA: Diagnosis not present

## 2018-09-02 NOTE — Progress Notes (Signed)
[place], [place] [address]   CLINIC:  Medical Oncology/Hematology  PCP:  [facility], [place] [address] [phone]   REASON FOR VISIT: Follow-up for leukocytosis and new hip pain after bone marrow biopsy   CURRENT THERAPY: observation    INTERVAL HISTORY:  Lucas Ramos 58 y.o. adult returns for routine follow-up for leukocytosis and new hip pain after bone marrow biopsy. He is here today with his wife. He reports ever since his biopsy he has had mild swelling in his hip and a sharp pain in his groin down his left leg to his foot. He had a steroid injection which helped it for a few days and it has returned. Denies any nausea, vomiting, or diarrhea. Denies any new pains. Had not noticed any recent bleeding such as epistaxis, hematuria or hematochezia. Denies recent chest pain on exertion, shortness of breath on minimal exertion, pre-syncopal episodes, or palpitations. Denies any numbness or tingling in hands or feet. Denies any recent fevers, infections, or recent hospitalizations. Patient reports appetite at 100% and energy level at 100%.    REVIEW OF SYSTEMS:  Review of Systems  Neurological: Positive for numbness.  All other systems reviewed and are negative.    PAST MEDICAL/SURGICAL HISTORY:  Past Medical History:  Diagnosis Date  . Anxiety   . Back pain   . Chronic pain   . Depression   . Esophageal reflux   . Fatigue   . History of gastric ulcer   . History of kidney stones   . Hx of substance abuse ([facility])    oxycodone   . Hyperlipidemia   . Hypertension    when asked if BP is under control, pt. states "so-so"; has been on med. x 15 yr.  . Hypogonadism in male   . Illiterate    states unable to read and write  . Nasal congestion 04/23/2017  . Neuropathy   . Painful orthopaedic hardware ([place]) 04/2017   left wrist  . Wears dentures    full   Past Surgical History:  Procedure  Laterality Date  . CARDIAC CATHETERIZATION  01/02/2005   normal coronaries  . CHOLECYSTECTOMY    . COLONOSCOPY  03/26/2011   Procedure: COLONOSCOPY;  Surgeon: Rogene Houston, MD;  [facility];  Service: Endoscopy;  Laterality: N/A;  . ELBOW SURGERY Bilateral   . ESOPHAGOGASTRODUODENOSCOPY    . FINGER SURGERY Left 2018   thumb  . HARDWARE REMOVAL Left 04/28/2017   Procedure: HARDWARE REMOVAL;  Surgeon: Milly Jakob, MD;  Location: [facility];  Service: Orthopedics;  Laterality: Left;  CONVERTED TO GENERAL FOR THIS PORTION OF PROCEDURE  . HIP ARTHROSCOPY  03/08/2012   Procedure: ARTHROSCOPY HIP;  Surgeon: Ninetta Lights, MD;  Location: [place] [place];  Service: Orthopedics;  Laterality: Left;  left hip arthoscopy with debridement   . NASAL SEPTOPLASTY W/ TURBINOPLASTY    . ORIF WRIST FRACTURE Left 09/2016  . OSTECTOMY Left 04/28/2017   Procedure: LEFT DORSAL WRIST EXPLORATION WITH  OSTECTOMY, EXTENSOR TENOLYSIS, AND LEFT VOLAR WRIST HARDWARE REMOVAL;  Surgeon: Milly Jakob, MD;  Location: [facility];  Service: Orthopedics;  Laterality: Left;  . TENOLYSIS Left 04/28/2017   Procedure: EXTENSORTENDON SHEATH TENOLYSIS;  Surgeon: Milly Jakob, MD;  Location: [facility];  Service: Orthopedics;  Laterality: Left;  . TONSILLECTOMY  1990s     SOCIAL HISTORY:  Social History   Socioeconomic History  .  Marital status: Married    Spouse name: Not on file  . Number of children: 1  . Years of education: Not on file  . Highest education level: Not on file  Occupational History  . Not on file  Social Needs  . Financial resource strain: Not on file  . Food insecurity:    Worry: Not on file    Inability: Not on file  . Transportation needs:    Medical: Not on file    Non-medical: Not on file  Tobacco Use  . Smoking status: Current Every Day Smoker    Packs/day: 1.00    Years: 42.00    Pack years: 42.00    Types: Cigarettes  . Smokeless  tobacco: Never Used  Substance and Sexual Activity  . Alcohol use: No  . Drug use: No  . Sexual activity: Yes  Lifestyle  . Physical activity:    Days per week: Not on file    Minutes per session: Not on file  . Stress: Not on file  Relationships  . Social connections:    Talks on phone: Not on file    Gets together: Not on file    Attends religious service: Not on file    Active member of club or organization: Not on file    Attends meetings of clubs or organizations: Not on file    Relationship status: Not on file  . Intimate partner violence:    Fear of current or ex partner: Not on file    Emotionally abused: Not on file    Physically abused: Not on file    Forced sexual activity: Not on file  Other Topics Concern  . Not on file  Social History Narrative  . Not on file    FAMILY HISTORY:  Family History  Problem Relation Age of Onset  . Cancer Father   . Coronary artery disease Father   . Hypertension Father   . Atrial fibrillation Father   . Pneumonia Mother   . Atrial fibrillation Mother   . Parkinson's disease Mother   . Coronary artery disease Sister   . Atrial fibrillation Sister   . Cancer Maternal Aunt   . Cancer Maternal Uncle   . Cancer Maternal Uncle     CURRENT MEDICATIONS:  Outpatient Encounter Medications as of 09/02/2018  Medication Sig  . acetaminophen (TYLENOL) 325 MG tablet Take 1 tablet (325 mg total) by mouth every 6 (six) hours as needed for mild pain or moderate pain.  . Chlorpheniramine Maleate (ALLERGY RELIEF PO) Take by mouth.  . escitalopram (LEXAPRO) 10 MG tablet Take 10 mg by mouth daily.  Marland Kitchen ezetimibe (ZETIA) 10 MG tablet Take 10 mg by mouth daily.  Marland Kitchen HYDROcodone-acetaminophen (NORCO) 10-325 MG per tablet Take 1 tablet by mouth 4 (four) times daily.  Marland Kitchen losartan (COZAAR) 100 MG tablet Take 100 mg by mouth daily.  . metoprolol (LOPRESSOR) 50 MG tablet Take 50 mg by mouth daily.  . [DISCONTINUED] gabapentin (NEURONTIN) 300 MG capsule  Take 300 mg by mouth 3 (three) times daily.  Marland Kitchen amLODipine (NORVASC) 5 MG tablet   . [DISCONTINUED] oxyCODONE (ROXICODONE) 5 MG immediate release tablet Take 1 tablet (5 mg total) by mouth every 6 (six) hours as needed for breakthrough pain.  . [DISCONTINUED] TESTOSTERONE IM Inject into the muscle every 14 (fourteen) days.   No facility-administered encounter medications on file as of 09/02/2018.     ALLERGIES:  Allergies  Allergen Reactions  . Aspirin Other (See Comments)  GI ulcers  . Cephalexin   . Zocor [Simvastatin]     polymyalgia     PHYSICAL EXAM:  ECOG Performance status: 1  Vitals:   09/02/18 1500  BP: 124/82  Pulse: 69  Resp: 16  Temp: 98.2 F (36.8 C)  SpO2: 96%   Filed Weights   09/02/18 1500  Weight: 162 lb 6 oz (73.7 kg)    Physical Exam Constitutional:      Appearance: Normal appearance. She is normal weight.  Cardiovascular:     Rate and Rhythm: Normal rate and regular rhythm.     Heart sounds: Normal heart sounds.  Pulmonary:     Effort: Pulmonary effort is normal.     Breath sounds: Normal breath sounds.  Musculoskeletal: Normal range of motion.  Skin:    General: Skin is warm and dry.  Neurological:     Mental Status: She is alert and oriented to person, place, and time. Mental status is at baseline.  Psychiatric:        Mood and Affect: Mood normal.        Behavior: Behavior normal.        Thought Content: Thought content normal.        Judgment: Judgment normal.   Abdomen: No palpable splenomegaly.   LABORATORY DATA:  I have reviewed the labs as listed.  CBC    Component Value Date/Time   WBC 11.3 (H) 01/20/2018 0831   RBC 5.14 01/20/2018 0831   HGB 16.5 01/20/2018 0831   HCT 47.8 01/20/2018 0831   PLT 385 01/20/2018 0831   MCV 93.0 01/20/2018 0831   MCH 32.1 01/20/2018 0831   MCHC 34.5 01/20/2018 0831   RDW 13.3 01/20/2018 0831   LYMPHSABS 3.1 01/20/2018 0831   MONOABS 1.2 (H) 01/20/2018 0831   EOSABS 0.3 01/20/2018  0831   BASOSABS 0.1 01/20/2018 0831   CMP Latest Ref Rng & Units 11/17/2013 03/04/2012  Glucose 70 - 99 mg/dL 88 106(H)  BUN 6 - 23 mg/dL 12 13  Creatinine 0.50 - 1.35 mg/dL 0.80 0.82  Sodium 137 - 147 mEq/L 138 139  Potassium 3.7 - 5.3 mEq/L 3.5(L) 3.8  Chloride 96 - 112 mEq/L 99 99  CO2 19 - 32 mEq/L 26 32  Calcium 8.4 - 10.5 mg/dL 9.4 9.4  Total Protein 6.0 - 8.3 g/dL 7.1 6.9  Total Bilirubin 0.3 - 1.2 mg/dL 0.5 0.3  Alkaline Phos 39 - 117 U/L 99 77  AST 0 - 37 U/L 25 23  ALT 0 - 53 U/L 17 17       DIAGNOSTIC IMAGING:  I have independently reviewed the scans and discussed with the patient.   I have reviewed Francene Finders, NP's note and agree with the documentation.  I personally performed a face-to-face visit, made revisions and my assessment and plan is as follows.    ASSESSMENT & PLAN:   Leukocytosis 1.  Reactive leukocytosis: -CBC from 2013, 15 and 19 show neutrophilic leukocytosis with slight monocytosis.  -Repeat CBC on 11/25/2017 shows elevated white count and platelet count.  Flow cytometry did not reveal any abnormalities.  BCR/ABL was negative.  Jak 2 V6 28F mutation was negative. CALR and MPLW were negative.  He does not have any palpable adenopathy or splenomegaly.  -BMBX on 01/20/2018 showed trilineage hematopoiesis with no abnormalities including dysplasia or leukemias.  Chromosome analysis shows 46, XY. -Etiology of his leukocytosis was thought to be related to smoking.  Patient is continuing to smoke 1 pack/day. -We  have reviewed his CBC from [facility].  White count was elevated around 15 K.  He was recommended to quit smoking.  He said he would seriously consider it. -We will see him back in 1 year for follow-up.  He was told to come back sooner should he develop any B symptoms.  2.  Thrombocytosis: -His platelet count was slightly elevated in 2015 and from March 2019.  Does not have any vasomotor symptoms.  No headaches were reported.  Repeat platelet count also  was high at 435.  Subsequent platelet count on 01/20/2018 was normal.      Orders placed this encounter:  No orders of the defined types were placed in this encounter.     Derek Jack, MD [place] [phone]

## 2018-09-02 NOTE — Patient Instructions (Signed)
[facility] Discharge Instructions  Follow up in 1 year with labs    Thank you for choosing  [facility] Center at [facility] to provide your oncology and hematology care.  To afford each patient quality time with our provider, please arrive at least 15 minutes before your scheduled appointment time.   If you have a lab appointment with the Cancer Center please come in thru the  Main Entrance and check in at the main information desk  You need to re-schedule your appointment should you arrive 10 or more minutes late.  We strive to give you quality time with our providers, and arriving late affects you and other patients whose appointments are after yours.  Also, if you no show three or more times for appointments you may be dismissed from the clinic at the providers discretion.     Again, thank you for choosing [facility].  Our hope is that these requests will decrease the amount of time that you wait before being seen by our physicians.       _____________________________________________________________  Should you have questions after your visit to [facility], please contact our office at [phone] between the hours of 8:00 a.m. and 4:30 p.m.  Voicemails left after 4:00 p.m. will not be returned until the following business day.  For prescription refill requests, have your pharmacy contact our office and allow 72 hours.    Cancer Center Support Programs:   > Cancer Support Group  2nd Tuesday of the month 1pm-2pm, Journey Room    

## 2018-09-03 ENCOUNTER — Encounter (HOSPITAL_COMMUNITY): Payer: Self-pay | Admitting: Hematology

## 2018-09-03 NOTE — Assessment & Plan Note (Signed)
1.  Reactive leukocytosis: -CBC from 2013, 15 and 19 show neutrophilic leukocytosis with slight monocytosis.  -Repeat CBC on 11/25/2017 shows elevated white count and platelet count.  Flow cytometry did not reveal any abnormalities.  BCR/ABL was negative.  Jak 2 V6 50F mutation was negative. CALR and MPLW were negative.  He does not have any palpable adenopathy or splenomegaly.  -BMBX on 01/20/2018 showed trilineage hematopoiesis with no abnormalities including dysplasia or leukemias.  Chromosome analysis shows 46, XY. -Etiology of his leukocytosis was thought to be related to smoking.  Patient is continuing to smoke 1 pack/day. -We have reviewed his CBC from [facility].  White count was elevated around 15 K.  He was recommended to quit smoking.  He said he would seriously consider it. -We will see him back in 1 year for follow-up.  He was told to come back sooner should he develop any B symptoms.  2.  Thrombocytosis: -His platelet count was slightly elevated in 2015 and from March 2019.  Does not have any vasomotor symptoms.  No headaches were reported.  Repeat platelet count also was high at 435.  Subsequent platelet count on 01/20/2018 was normal.

## 2019-06-15 ENCOUNTER — Other Ambulatory Visit: Payer: Self-pay | Admitting: *Deleted

## 2019-06-15 DIAGNOSIS — Z20822 Contact with and (suspected) exposure to covid-19: Secondary | ICD-10-CM

## 2019-06-17 ENCOUNTER — Ambulatory Visit: Payer: Self-pay

## 2019-06-17 LAB — NOVEL CORONAVIRUS, NAA: SARS-CoV-2, NAA: NOT DETECTED

## 2019-06-17 NOTE — Telephone Encounter (Signed)
Result are not available yet.

## 2019-09-01 ENCOUNTER — Ambulatory Visit (HOSPITAL_COMMUNITY): Payer: Self-pay | Admitting: Hematology

## 2020-05-10 ENCOUNTER — Other Ambulatory Visit (HOSPITAL_COMMUNITY): Payer: Self-pay | Admitting: Internal Medicine

## 2020-05-10 ENCOUNTER — Other Ambulatory Visit: Payer: Self-pay

## 2020-05-10 ENCOUNTER — Ambulatory Visit (HOSPITAL_COMMUNITY)
Admission: RE | Admit: 2020-05-10 | Discharge: 2020-05-10 | Disposition: A | Discharge status: Home / Self Care | Payer: Managed Care, Other (non HMO) | Source: Ambulatory Visit | Attending: Internal Medicine | Admitting: Internal Medicine

## 2020-05-10 DIAGNOSIS — R051 Acute cough: Secondary | ICD-10-CM | POA: Diagnosis present

## 2020-05-10 DIAGNOSIS — R0781 Pleurodynia: Secondary | ICD-10-CM | POA: Diagnosis not present

## 2020-10-11 IMAGING — DX DG RIBS 2V*R*
3 series · 3 of 3 positions shown · non-contrast
Comparison: Chest x-ray same day.  Chest x-ray 04/23/2016.

CLINICAL DATA: Pleuritic chest pain.

EXAM:
RIGHT RIBS - 2 VIEW

[rib pa]
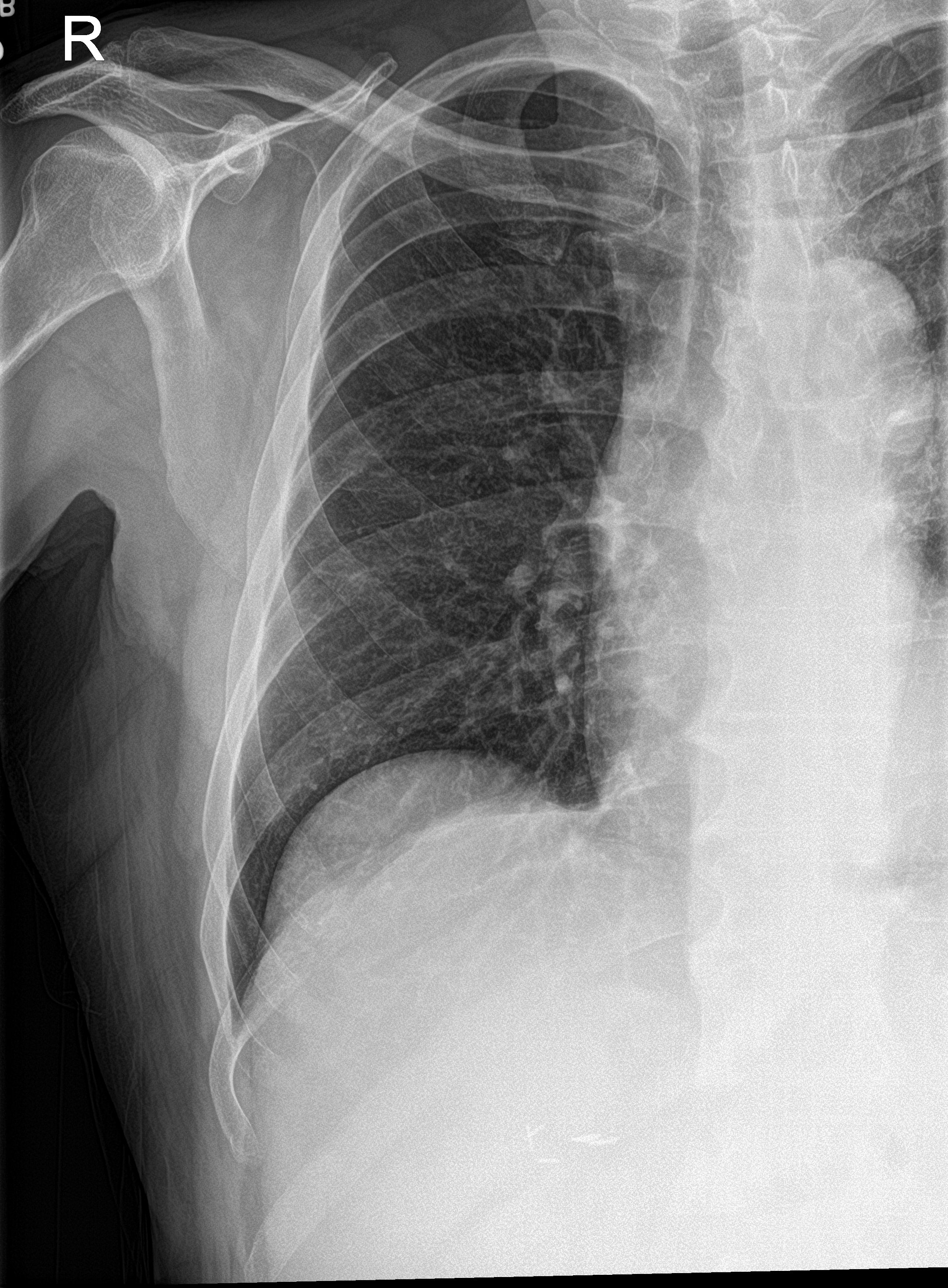

[rib pa obl (1 of 2)]
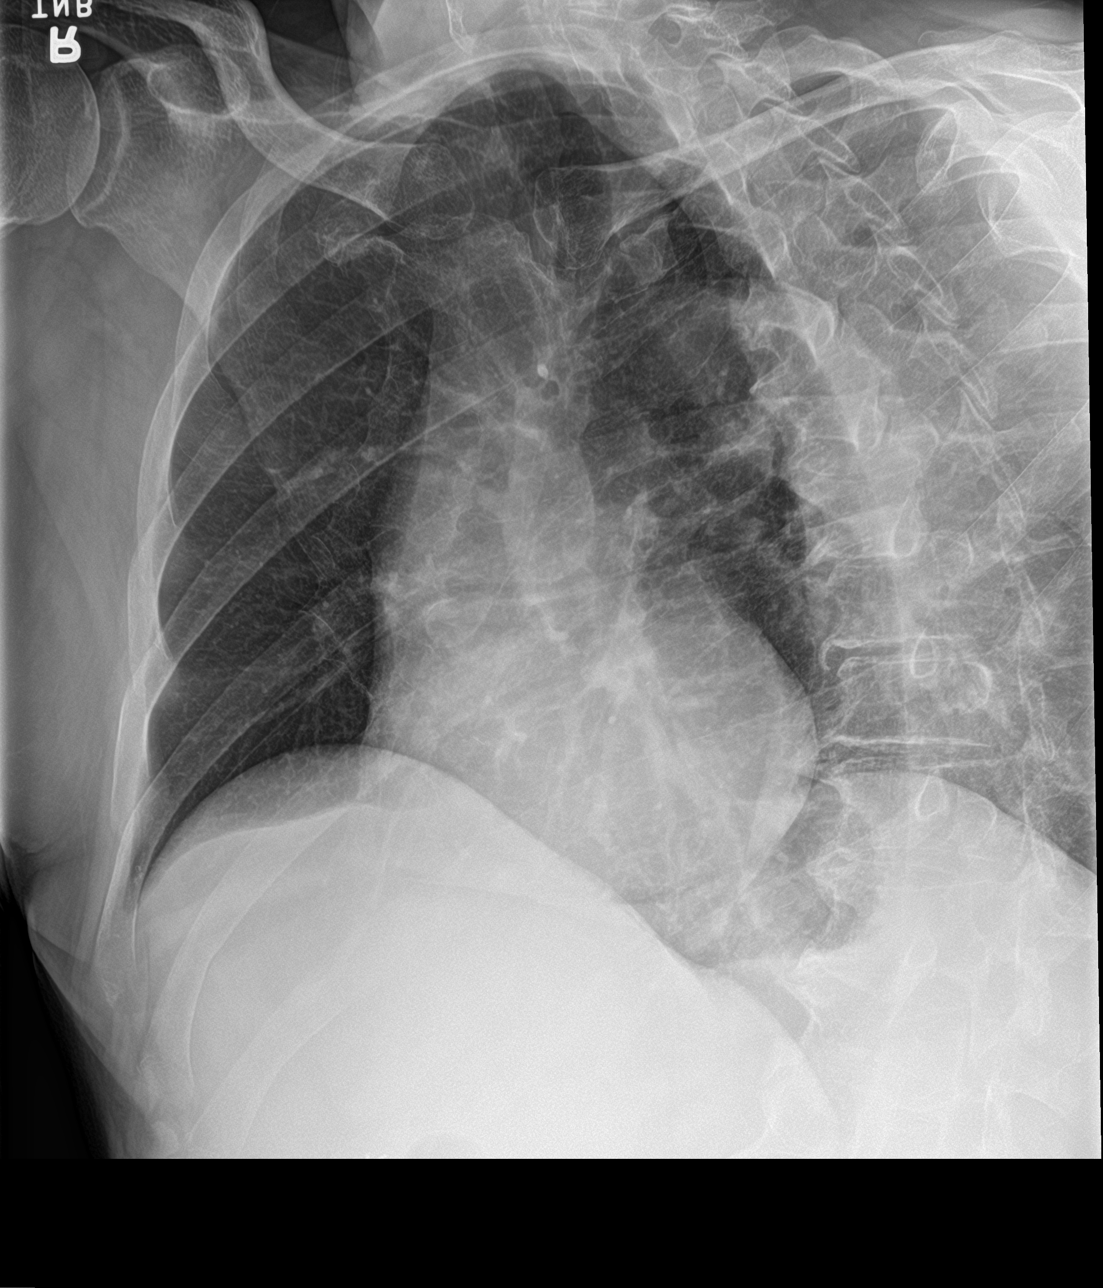

[rib pa obl (2 of 2)]
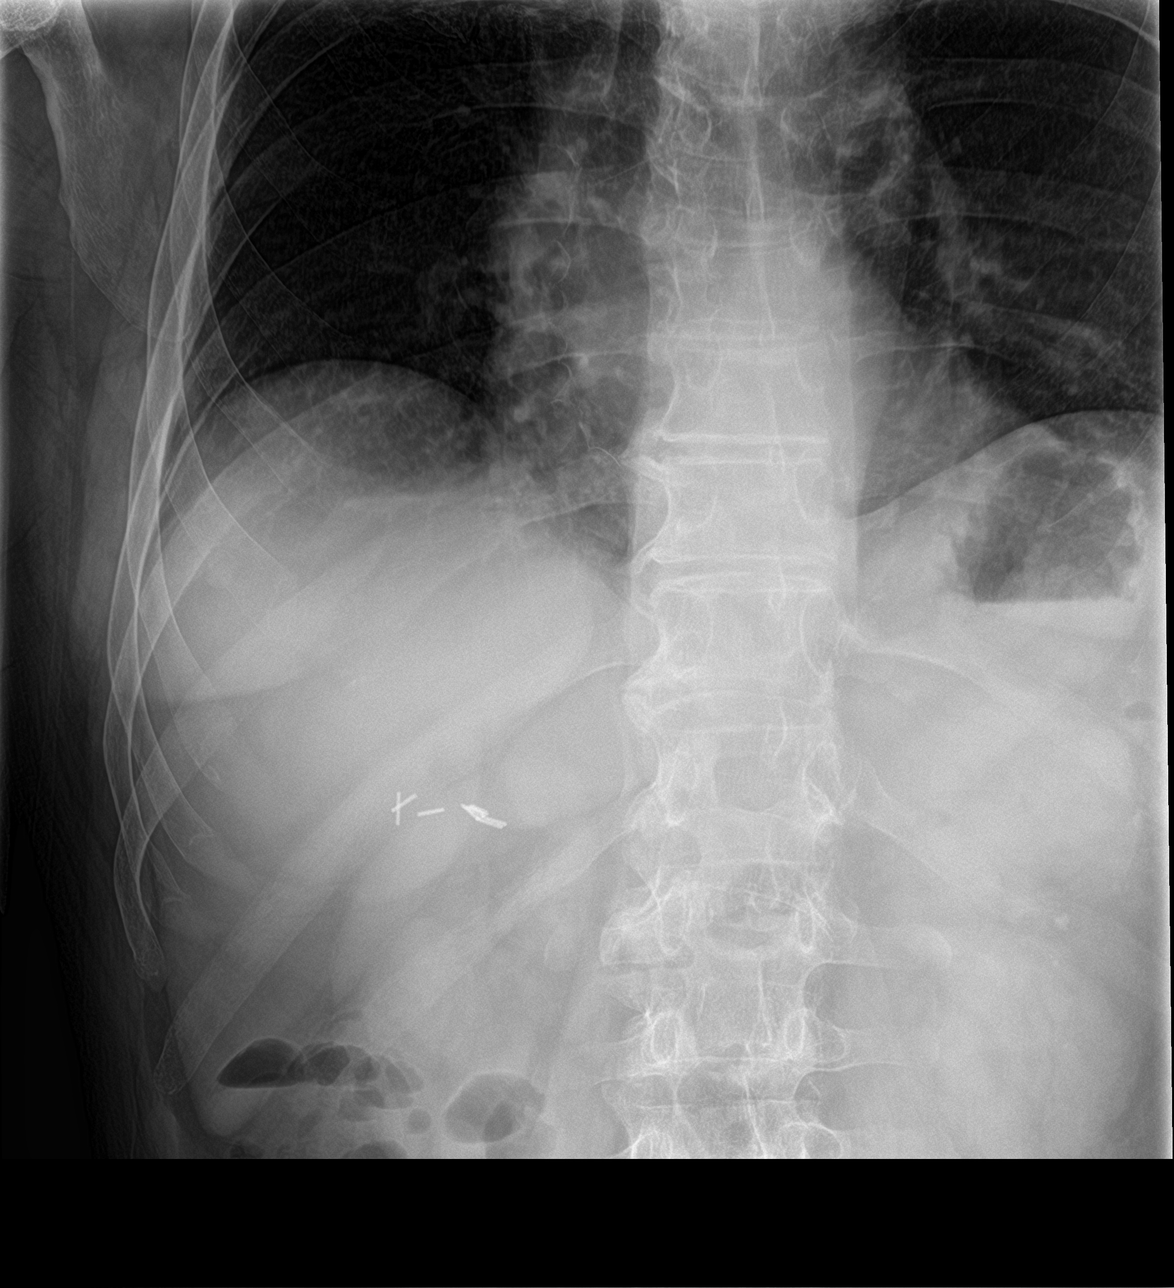

[3 of 3 positions shown; findings below may reference images not displayed]

FINDINGS: No acute or focal bony abnormality identified. No evidence of
displaced rib fracture or pneumothorax. Degenerative change thoracic
spine. Mild pulmonary interstitial prominence. Surgical clips right
upper quadrant.
IMPRESSION: 1. No acute or focal bony abnormality identified. No evidence of
displaced rib fracture or pneumothorax.
2. Mild pulmonary interstitial prominence.

## 2023-09-07 DIAGNOSIS — Z1212 Encounter for screening for malignant neoplasm of rectum: Secondary | ICD-10-CM | POA: Diagnosis not present

## 2023-09-07 DIAGNOSIS — Z1211 Encounter for screening for malignant neoplasm of colon: Secondary | ICD-10-CM | POA: Diagnosis not present

## 2023-10-07 DIAGNOSIS — F17210 Nicotine dependence, cigarettes, uncomplicated: Secondary | ICD-10-CM | POA: Diagnosis not present

## 2023-10-07 DIAGNOSIS — Z6825 Body mass index (BMI) 25.0-25.9, adult: Secondary | ICD-10-CM | POA: Diagnosis not present

## 2023-10-07 DIAGNOSIS — M545 Low back pain, unspecified: Secondary | ICD-10-CM | POA: Diagnosis not present

## 2023-10-07 DIAGNOSIS — E663 Overweight: Secondary | ICD-10-CM | POA: Diagnosis not present

## 2023-10-07 DIAGNOSIS — Z79891 Long term (current) use of opiate analgesic: Secondary | ICD-10-CM | POA: Diagnosis not present

## 2023-10-07 DIAGNOSIS — F17221 Nicotine dependence, chewing tobacco, in remission: Secondary | ICD-10-CM | POA: Diagnosis not present

## 2023-10-07 DIAGNOSIS — I10 Essential (primary) hypertension: Secondary | ICD-10-CM | POA: Diagnosis not present

## 2023-10-07 DIAGNOSIS — E785 Hyperlipidemia, unspecified: Secondary | ICD-10-CM | POA: Diagnosis not present

## 2023-10-07 DIAGNOSIS — Z008 Encounter for other general examination: Secondary | ICD-10-CM | POA: Diagnosis not present

## 2023-10-07 DIAGNOSIS — F411 Generalized anxiety disorder: Secondary | ICD-10-CM | POA: Diagnosis not present

## 2023-11-13 DIAGNOSIS — M1991 Primary osteoarthritis, unspecified site: Secondary | ICD-10-CM | POA: Diagnosis not present

## 2023-11-13 DIAGNOSIS — G894 Chronic pain syndrome: Secondary | ICD-10-CM | POA: Diagnosis not present

## 2023-11-13 DIAGNOSIS — R7309 Other abnormal glucose: Secondary | ICD-10-CM | POA: Diagnosis not present

## 2023-11-13 DIAGNOSIS — M5432 Sciatica, left side: Secondary | ICD-10-CM | POA: Diagnosis not present

## 2023-11-13 DIAGNOSIS — Z9229 Personal history of other drug therapy: Secondary | ICD-10-CM | POA: Diagnosis not present

## 2023-11-13 DIAGNOSIS — Z6826 Body mass index (BMI) 26.0-26.9, adult: Secondary | ICD-10-CM | POA: Diagnosis not present

## 2023-11-13 DIAGNOSIS — E663 Overweight: Secondary | ICD-10-CM | POA: Diagnosis not present

## 2023-11-13 DIAGNOSIS — I10 Essential (primary) hypertension: Secondary | ICD-10-CM | POA: Diagnosis not present

## 2023-11-25 DIAGNOSIS — X32XXXD Exposure to sunlight, subsequent encounter: Secondary | ICD-10-CM | POA: Diagnosis not present

## 2023-11-25 DIAGNOSIS — Z1283 Encounter for screening for malignant neoplasm of skin: Secondary | ICD-10-CM | POA: Diagnosis not present

## 2023-11-25 DIAGNOSIS — D225 Melanocytic nevi of trunk: Secondary | ICD-10-CM | POA: Diagnosis not present

## 2023-11-25 DIAGNOSIS — L570 Actinic keratosis: Secondary | ICD-10-CM | POA: Diagnosis not present

## 2024-01-06 DIAGNOSIS — L820 Inflamed seborrheic keratosis: Secondary | ICD-10-CM | POA: Diagnosis not present

## 2024-01-06 DIAGNOSIS — L708 Other acne: Secondary | ICD-10-CM | POA: Diagnosis not present

## 2024-01-06 DIAGNOSIS — D485 Neoplasm of uncertain behavior of skin: Secondary | ICD-10-CM | POA: Diagnosis not present

## 2024-01-06 DIAGNOSIS — L728 Other follicular cysts of the skin and subcutaneous tissue: Secondary | ICD-10-CM | POA: Diagnosis not present

## 2024-01-06 DIAGNOSIS — L738 Other specified follicular disorders: Secondary | ICD-10-CM | POA: Diagnosis not present

## 2024-01-12 DIAGNOSIS — I10 Essential (primary) hypertension: Secondary | ICD-10-CM | POA: Diagnosis not present

## 2024-01-12 DIAGNOSIS — E663 Overweight: Secondary | ICD-10-CM | POA: Diagnosis not present

## 2024-01-12 DIAGNOSIS — Z6826 Body mass index (BMI) 26.0-26.9, adult: Secondary | ICD-10-CM | POA: Diagnosis not present

## 2024-01-12 DIAGNOSIS — M1991 Primary osteoarthritis, unspecified site: Secondary | ICD-10-CM | POA: Diagnosis not present

## 2024-01-12 DIAGNOSIS — G894 Chronic pain syndrome: Secondary | ICD-10-CM | POA: Diagnosis not present

## 2024-03-11 DIAGNOSIS — M1991 Primary osteoarthritis, unspecified site: Secondary | ICD-10-CM | POA: Diagnosis not present

## 2024-03-11 DIAGNOSIS — G894 Chronic pain syndrome: Secondary | ICD-10-CM | POA: Diagnosis not present

## 2024-03-11 DIAGNOSIS — I10 Essential (primary) hypertension: Secondary | ICD-10-CM | POA: Diagnosis not present

## 2024-03-11 DIAGNOSIS — M5432 Sciatica, left side: Secondary | ICD-10-CM | POA: Diagnosis not present

## 2024-03-11 DIAGNOSIS — J329 Chronic sinusitis, unspecified: Secondary | ICD-10-CM | POA: Diagnosis not present

## 2024-03-11 DIAGNOSIS — E663 Overweight: Secondary | ICD-10-CM | POA: Diagnosis not present

## 2024-03-11 DIAGNOSIS — Z6826 Body mass index (BMI) 26.0-26.9, adult: Secondary | ICD-10-CM | POA: Diagnosis not present

## 2024-03-23 ENCOUNTER — Ambulatory Visit (INDEPENDENT_AMBULATORY_CARE_PROVIDER_SITE_OTHER)

## 2024-03-23 ENCOUNTER — Ambulatory Visit
Admission: RE | Admit: 2024-03-23 | Discharge: 2024-03-23 | Disposition: A | Discharge status: Home / Self Care | Payer: Self-pay | Source: Ambulatory Visit | Attending: Nurse Practitioner | Admitting: Nurse Practitioner

## 2024-03-23 VITALS — BP 129/83 | HR 67 | Temp 98.7°F | Resp 16

## 2024-03-23 DIAGNOSIS — K5900 Constipation, unspecified: Secondary | ICD-10-CM | POA: Insufficient documentation

## 2024-03-23 DIAGNOSIS — R109 Unspecified abdominal pain: Secondary | ICD-10-CM | POA: Insufficient documentation

## 2024-03-23 DIAGNOSIS — Z87442 Personal history of urinary calculi: Secondary | ICD-10-CM | POA: Insufficient documentation

## 2024-03-23 DIAGNOSIS — N200 Calculus of kidney: Secondary | ICD-10-CM | POA: Diagnosis not present

## 2024-03-23 LAB — POCT URINE DIPSTICK
Bilirubin, UA: NEGATIVE
Glucose, UA: NEGATIVE mg/dL
Leukocytes, UA: NEGATIVE
Nitrite, UA: NEGATIVE
POC PROTEIN,UA: NEGATIVE
Spec Grav, UA: 1.015 (ref 1.010–1.025)
Urobilinogen, UA: 0.2 U/dL
pH, UA: 6 (ref 5.0–8.0)

## 2024-03-23 MED ORDER — TAMSULOSIN HCL 0.4 MG PO CAPS: 0.4000 mg | ORAL_CAPSULE | Freq: Every day | ORAL | 0 refills | Status: AC

## 2024-03-23 MED ORDER — SENNOSIDES-DOCUSATE SODIUM 8.6-50 MG PO TABS: 2.0000 | ORAL_TABLET | Freq: Every evening | ORAL | 0 refills | Status: AC | PRN

## 2024-03-23 NOTE — ED Provider Notes (Signed)
 [facility]    CSN: [id number] Arrival date & time: 03/23/24  0945      History   Chief Complaint Chief Complaint  Patient presents with   Flank Pain    I think I have a kidney stone. - Entered by patient    HPI Lucas Ramos is a 63 y.o. adult.   The history is provided by the patient.   Patient presents with a 3-day history of right flank pain.  Patient states pain started suddenly.  He states the pain comes and goes.SABRA  He endorses pain worsens with movement and when he is lying down.  He denies fever, chills, hematuria, decreased urine stream, low back pain, vomiting, or diarrhea.  Patient states that he did experience an episode of nausea when his symptoms initially started.  Patient also reports that he does have a prior history of kidney stones.  Patient spouse states that in the past when the patient saw urologist several years ago, they had to go in and fish the stone out as the stone had a hook on it.  Patient states he has taken his prescription for hydrocodone  for his pain.  Rates pain 2/10 at present.  He has also increased his water  intake.  Past Medical History:  Diagnosis Date   Anxiety    Back pain    Chronic pain    Depression    Esophageal reflux    Fatigue    History of gastric ulcer    History of kidney stones    Hx of substance abuse (HCC)    oxycodone     Hyperlipidemia    Hypertension    when asked if BP is under control, pt. states so-so; has been on med. x 15 yr.   Hypogonadism in male    Illiterate    states unable to read and write   Nasal congestion 04/23/2017   Neuropathy    Painful orthopaedic hardware (HCC) 04/2017   left wrist   Wears dentures    full    Patient Active Problem List   Diagnosis Date Noted   Leukocytosis 11/25/2017   Thrombocytosis 11/25/2017    Past Surgical History:  Procedure Laterality Date   CARDIAC CATHETERIZATION  01/02/2005   normal coronaries   CHOLECYSTECTOMY     COLONOSCOPY   03/26/2011   Procedure: COLONOSCOPY;  Surgeon: Claudis RAYMOND Rivet, MD;  Location: [facility];  Service: Endoscopy;  Laterality: N/A;   ELBOW SURGERY Bilateral    ESOPHAGOGASTRODUODENOSCOPY     FINGER SURGERY Left 2018   thumb   HARDWARE REMOVAL Left 04/28/2017   Procedure: HARDWARE REMOVAL;  Surgeon: Sebastian Lenis, MD;  Location: [facility];  Service: Orthopedics;  Laterality: Left;  CONVERTED TO GENERAL FOR THIS PORTION OF PROCEDURE   HIP ARTHROSCOPY  03/08/2012   Procedure: ARTHROSCOPY HIP;  Surgeon: Toribio JULIANNA Chancy, MD;  Location: [facility];  Service: Orthopedics;  Laterality: Left;  left hip arthoscopy with debridement    NASAL SEPTOPLASTY W/ TURBINOPLASTY     ORIF WRIST FRACTURE Left 09/2016   OSTECTOMY Left 04/28/2017   Procedure: LEFT DORSAL WRIST EXPLORATION WITH  OSTECTOMY, EXTENSOR TENOLYSIS, AND LEFT VOLAR WRIST HARDWARE REMOVAL;  Surgeon: Sebastian Lenis, MD;  Location: [facility];  Service: Orthopedics;  Laterality: Left;   TENOLYSIS Left 04/28/2017   Procedure: EXTENSORTENDON SHEATH TENOLYSIS;  Surgeon: Sebastian Lenis, MD;  Location: [facility];  Service: Orthopedics;  Laterality: Left;   TONSILLECTOMY  1990s  Home Medications    Prior to Admission medications   Medication Sig Start Date End Date Taking? Authorizing Provider  senna-docusate (SENOKOT-S) 8.6-50 MG tablet Take 2 tablets by mouth at bedtime as needed for mild constipation. 03/23/24  Yes Leath-Warren, Etta PARAS, NP  tamsulosin  (FLOMAX ) 0.4 MG CAPS capsule Take 1 capsule (0.4 mg total) by mouth daily after supper. 03/23/24  Yes Leath-Warren, Etta PARAS, NP  acetaminophen  (TYLENOL ) 325 MG tablet Take 1 tablet (325 mg total) by mouth every 6 (six) hours as needed for mild pain or moderate pain. 04/28/17   Sebastian Lenis, MD  amLODipine (NORVASC) 5 MG tablet  08/31/18   [provider]  Chlorpheniramine Maleate (ALLERGY RELIEF PO) Take by mouth.    [provider]  escitalopram (LEXAPRO) 10 MG tablet Take 10 mg by mouth daily.    [provider]  ezetimibe (ZETIA) 10 MG tablet Take 10 mg by mouth daily.    [provider]  HYDROcodone -acetaminophen  (NORCO) 10-325 MG per tablet Take 1 tablet by mouth 4 (four) times daily.    [provider]  losartan (COZAAR) 100 MG tablet Take 100 mg by mouth daily.    [provider]  metoprolol (LOPRESSOR) 50 MG tablet Take 50 mg by mouth daily.    [provider]    Family History Family History  Problem Relation Age of Onset   Cancer Father    Coronary artery disease Father    Hypertension Father    Atrial fibrillation Father    Pneumonia Mother    Atrial fibrillation Mother    Parkinson's disease Mother    Coronary artery disease Sister    Atrial fibrillation Sister    Cancer Maternal Aunt    Cancer Maternal Uncle    Cancer Maternal Uncle     Social History Social History   Tobacco Use   Smoking status: Every Day    Current packs/day: 1.00    Average packs/day: 1 pack/day for 42.0 years (42.0 ttl pk-yrs)    Types: Cigarettes   Smokeless tobacco: Never  Vaping Use   Vaping status: Never Used  Substance Use Topics   Alcohol use: No   Drug use: No     Allergies   Aspirin, Cephalexin, and Zocor [simvastatin]   Review of Systems Review of Systems Per HPI  Physical Exam Triage Vital Signs ED Triage Vitals  Encounter Vitals Group     BP 03/23/24 0959 129/83     Girls Systolic BP Percentile --      Girls Diastolic BP Percentile --      Boys Systolic BP Percentile --      Boys Diastolic BP Percentile --      Pulse Rate 03/23/24 0959 67     Resp 03/23/24 0959 16     Temp 03/23/24 0959 98.7 F (37.1 C)     Temp Source 03/23/24 0959 Oral     SpO2 03/23/24 0959 92 %     Weight --      Height --      Head Circumference --      Peak Flow --      Pain Score 03/23/24 1006 9     Pain Loc --      Pain Education --      Exclude  from Growth Chart --    No data found.  Updated Vital Signs BP 129/83 (BP Location: Right Arm)   Pulse 67   Temp 98.7 F (37.1 C) (Oral)  Resp 16   SpO2 92%   Visual Acuity Right Eye Distance:   Left Eye Distance:   Bilateral Distance:    Right Eye Near:   Left Eye Near:    Bilateral Near:     Physical Exam Vitals and nursing note reviewed.  Constitutional:      General: She is not in acute distress.    Appearance: Normal appearance.  HENT:     Head: Normocephalic.  Eyes:     Extraocular Movements: Extraocular movements intact.     Pupils: Pupils are equal, round, and reactive to light.  Cardiovascular:     Rate and Rhythm: Normal rate and regular rhythm.     Pulses: Normal pulses.     Heart sounds: Normal heart sounds.  Pulmonary:     Effort: Pulmonary effort is normal. No respiratory distress.     Breath sounds: Normal breath sounds. No stridor. No wheezing, rhonchi or rales.  Abdominal:     General: Bowel sounds are normal.     Palpations: Abdomen is soft.     Tenderness: There is no abdominal tenderness. There is right CVA tenderness. There is no left CVA tenderness.  Musculoskeletal:     Cervical back: Normal range of motion.  Skin:    General: Skin is warm and dry.  Neurological:     General: No focal deficit present.     Mental Status: She is alert and oriented to person, place, and time.  Psychiatric:        Mood and Affect: Mood normal.        Behavior: Behavior normal.      UC Treatments / Results  Labs (all labs ordered are listed, but only abnormal results are displayed) Labs Reviewed  POCT URINE DIPSTICK - Abnormal; Notable for the following components:      Result Value   Ketones, POC UA trace (5) (*)    Blood, UA trace-intact (*)    All other components within normal limits  URINE CULTURE    EKG   Radiology DG Abd 2 Views Result Date: 03/23/2024 CLINICAL DATA:  Right flank pain. EXAM: ABDOMEN - 2 VIEW COMPARISON:  None  Available. FINDINGS: Nonobstructive bowel-gas pattern. Moderate volume of stool in the right colon. Subtle 3 mm density overlying the upper pole of the right kidney could reflect a calculus. Cholecystectomy clips in the right upper quadrant. No acute osseous abnormality. IMPRESSION: 1. Subtle 3 mm density overlying the upper pole of the right kidney could reflect a calculus. 2. Nonobstructive bowel-gas pattern. Moderate volume of stool in the right colon. Electronically Signed   By: Harrietta Sherry M.D.   On: 03/23/2024 11:06    Procedures Procedures (including critical care time)  Medications Ordered in UC Medications - No data to display  Initial Impression / Assessment and Plan / UC Course  I have reviewed the triage vital signs and the nursing notes.  Pertinent labs & imaging results that were available during my care of the patient were reviewed by me and considered in my medical decision making (see chart for details).  X-ray of the abdomen shows a 3 mm stone on the right kidney along with moderate stool burden.  Will treat with tamsulosin  0.4 mg and Senokot 8.6/50 mg.  Referral was also placed for urology, patient and spouse advised to call to schedule an appointment.  Supportive care recommendations were provided and discussed with patient and his spouse to include fluids, continuing his current pain regimen, straining his urine,  dietary changes to help with constipation.  Discussed ER follow-up precautions with patient and spouse.  Patient and spouse were in agreement with this plan of care and verbalizes understanding.  All questions were answered.  Patient stable for discharge   Final Clinical Impressions(s) / UC Diagnoses   Final diagnoses:  Right flank pain  Right kidney stone  Constipation, unspecified constipation type  History of kidney stones     Discharge Instructions      The x-ray does show a right kidney stone.  It also shows some stool burden in the right colon  which may also be impacting your symptoms. Take medication as prescribed. You may continue your regular medications for pain.  Continue to drink plenty of fluids.  Try to drink at least 8-10 8 ounce glasses of water  daily while symptoms persist. Develop a toileting schedule that will allow you to urinate at least every 2 hours. I have made a referral to a urologist office in the area.  Please call their office to schedule an appointment today. Go to the emergency department if you experience worsening pain, become unable to urinate, or other concerns.  For the constipation: Make sure you are eating at least 2-3 servings of vegetables daily. Continue to drink plenty of fluids. Continue your current pain regimen.  Recommend taking a stool softener daily while you are taking the pain medication. If symptoms fail to improve proved, please follow-up with your primary care physician for further evaluation.  Follow-up as needed. Follow-up as needed.      ED Prescriptions     Medication Sig Dispense Auth. Provider   tamsulosin  (FLOMAX ) 0.4 MG CAPS capsule Take 1 capsule (0.4 mg total) by mouth daily after supper. 30 capsule Leath-Warren, Etta PARAS, NP   senna-docusate (SENOKOT-S) 8.6-50 MG tablet Take 2 tablets by mouth at bedtime as needed for mild constipation. 30 tablet Leath-Warren, Etta PARAS, NP      PDMP not reviewed this encounter.   Gilmer Etta PARAS, NP 03/23/24 1122

## 2024-03-23 NOTE — ED Triage Notes (Addendum)
 Pt reports right flank pain near kidney since Sunday evening , denies difficulty urinating, pt has been using his hydrocodone  that he has for his back that seems to help with the pain. Also drinking plenty of water  in an attempt to flush out the possible stone.

## 2024-03-23 NOTE — Discharge Instructions (Addendum)
 The x-ray does show a right kidney stone.  It also shows some stool burden in the right colon which may also be impacting your symptoms. Take medication as prescribed. You may continue your regular medications for pain.  Continue to drink plenty of fluids.  Try to drink at least 8-10 8 ounce glasses of water  daily while symptoms persist. Develop a toileting schedule that will allow you to urinate at least every 2 hours. I have made a referral to a urologist office in the area.  Please call their office to schedule an appointment today. Go to the emergency department if you experience worsening pain, become unable to urinate, or other concerns.  For the constipation: Make sure you are eating at least 2-3 servings of vegetables daily. Continue to drink plenty of fluids. Continue your current pain regimen.  Recommend taking a stool softener daily while you are taking the pain medication. If symptoms fail to improve proved, please follow-up with your primary care physician for further evaluation.  Follow-up as needed. Follow-up as needed.

## 2024-03-24 LAB — URINE CULTURE: Culture: NO GROWTH

## 2024-03-25 ENCOUNTER — Ambulatory Visit (HOSPITAL_COMMUNITY): Payer: Self-pay

## 2024-05-13 ENCOUNTER — Other Ambulatory Visit (HOSPITAL_COMMUNITY): Payer: Self-pay

## 2024-05-13 DIAGNOSIS — Z720 Tobacco use: Secondary | ICD-10-CM

## 2024-05-19 ENCOUNTER — Encounter (INDEPENDENT_AMBULATORY_CARE_PROVIDER_SITE_OTHER): Payer: Self-pay | Admitting: *Deleted

## 2024-05-30 ENCOUNTER — Ambulatory Visit (HOSPITAL_COMMUNITY): Admission: RE | Admit: 2024-05-30 | Source: Ambulatory Visit

## 2024-05-31 ENCOUNTER — Ambulatory Visit (HOSPITAL_COMMUNITY)
Admission: RE | Admit: 2024-05-31 | Discharge: 2024-05-31 | Disposition: A | Discharge status: Home / Self Care | Source: Ambulatory Visit

## 2024-05-31 DIAGNOSIS — J439 Emphysema, unspecified: Secondary | ICD-10-CM | POA: Insufficient documentation

## 2024-05-31 DIAGNOSIS — Z720 Tobacco use: Secondary | ICD-10-CM

## 2024-05-31 DIAGNOSIS — Z122 Encounter for screening for malignant neoplasm of respiratory organs: Secondary | ICD-10-CM | POA: Insufficient documentation

## 2024-05-31 DIAGNOSIS — I2510 Atherosclerotic heart disease of native coronary artery without angina pectoris: Secondary | ICD-10-CM | POA: Insufficient documentation

## 2024-05-31 DIAGNOSIS — F17210 Nicotine dependence, cigarettes, uncomplicated: Secondary | ICD-10-CM | POA: Diagnosis not present

## 2024-05-31 DIAGNOSIS — I77810 Thoracic aortic ectasia: Secondary | ICD-10-CM | POA: Diagnosis not present

## 2024-05-31 DIAGNOSIS — I700 Atherosclerosis of aorta: Secondary | ICD-10-CM | POA: Insufficient documentation

## 2024-09-05 ENCOUNTER — Ambulatory Visit: Admitting: Pain Medicine

## 2024-09-21 ENCOUNTER — Ambulatory Visit: Admitting: Pain Medicine
